# Patient Record
Sex: Male | Born: 1955
Health system: Southern US, Community
[De-identification: ages and names within clinical notes are randomized; demographics above are authoritative.]

## PROBLEM LIST (undated history)

## (undated) DIAGNOSIS — E785 Hyperlipidemia, unspecified: Secondary | ICD-10-CM

## (undated) DIAGNOSIS — R5383 Other fatigue: Secondary | ICD-10-CM

## (undated) DIAGNOSIS — T7840XA Allergy, unspecified, initial encounter: Secondary | ICD-10-CM

## (undated) DIAGNOSIS — S76311A Strain of muscle, fascia and tendon of the posterior muscle group at thigh level, right thigh, initial encounter: Secondary | ICD-10-CM

## (undated) DIAGNOSIS — R008 Other abnormalities of heart beat: Secondary | ICD-10-CM

## (undated) DIAGNOSIS — C4490 Unspecified malignant neoplasm of skin, unspecified: Secondary | ICD-10-CM

## (undated) DIAGNOSIS — Z8719 Personal history of other diseases of the digestive system: Secondary | ICD-10-CM

## (undated) DIAGNOSIS — R002 Palpitations: Secondary | ICD-10-CM

## (undated) DIAGNOSIS — J45909 Unspecified asthma, uncomplicated: Secondary | ICD-10-CM

## (undated) DIAGNOSIS — G500 Trigeminal neuralgia: Secondary | ICD-10-CM

## (undated) HISTORY — PX: MOUTH SURGERY: SHX715

## (undated) HISTORY — DX: Unspecified asthma, uncomplicated: J45.909

## (undated) HISTORY — DX: Other abnormalities of heart beat: R00.8

## (undated) HISTORY — DX: Allergy, unspecified, initial encounter: T78.40XA

## (undated) HISTORY — DX: Hyperlipidemia, unspecified: E78.5

## (undated) HISTORY — DX: Other fatigue: R53.83

---
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
## 2014-10-03 ENCOUNTER — Encounter: Payer: Self-pay | Admitting: Family Medicine

## 2014-10-03 ENCOUNTER — Encounter (INDEPENDENT_AMBULATORY_CARE_PROVIDER_SITE_OTHER): Payer: Self-pay

## 2014-10-03 ENCOUNTER — Ambulatory Visit (INDEPENDENT_AMBULATORY_CARE_PROVIDER_SITE_OTHER): Payer: Managed Care, Other (non HMO) | Admitting: Family Medicine

## 2014-10-03 VITALS — BP 132/85 | HR 56 | Ht 71.0 in | Wt 150.0 lb

## 2014-10-03 DIAGNOSIS — S76311A Strain of muscle, fascia and tendon of the posterior muscle group at thigh level, right thigh, initial encounter: Secondary | ICD-10-CM

## 2014-10-03 MED ORDER — NITROGLYCERIN 0.2 MG/HR TD PT24: MEDICATED_PATCH | TRANSDERMAL | Status: DC

## 2014-10-03 NOTE — Patient Instructions (Signed)
You have a hamstring strain. Do hamstring curls and swings 3 sets of 10 once a day.  Add ankle weight if these become too easy. Eccentric exercise at the gym as you have been doing with light weight - also 3 sets of 10 - slowly increase the weight as long as pain is minimally. Compression sleeve - wear every time you're up and walking around for next 6 weeks. Heat 15 minutes at a time 3-4 times a day - can use ice after exercising instead. Nitro patches - 1/4th patch over affected area, change daily. Follow up with me in 4 weeks or just before the race.

## 2014-10-10 ENCOUNTER — Ambulatory Visit
Admission: RE | Admit: 2014-10-10 | Discharge: 2014-10-10 | Disposition: A | Discharge status: Home / Self Care | Payer: Managed Care, Other (non HMO) | Source: Ambulatory Visit | Attending: Orthopedic Surgery | Admitting: Orthopedic Surgery

## 2014-10-10 ENCOUNTER — Other Ambulatory Visit: Payer: Self-pay | Admitting: Orthopedic Surgery

## 2014-10-10 DIAGNOSIS — S82122A Displaced fracture of lateral condyle of left tibia, initial encounter for closed fracture: Secondary | ICD-10-CM

## 2014-10-10 DIAGNOSIS — S76311A Strain of muscle, fascia and tendon of the posterior muscle group at thigh level, right thigh, initial encounter: Secondary | ICD-10-CM | POA: Insufficient documentation

## 2014-10-10 HISTORY — DX: Strain of muscle, fascia and tendon of the posterior muscle group at thigh level, right thigh, initial encounter: S76.311A

## 2014-10-10 NOTE — Assessment & Plan Note (Signed)
shown home exercises to do daily.  Compression sleeve, heat.  Trial nitro patches as well - discussed risks of headache, skin irritation.  Advised against injection as this was an acute injury with strain, not a tendinopathy.

## 2014-10-10 NOTE — Progress Notes (Signed)
PCP: No primary care provider on file.  Subjective:   HPI: Patient is a 59 y.o. male here for right hamstring injury.  Patient reports about 4 weeks ago when running at [facility] on the track his right foot was caught in his running pants, he tripped and felt a pull in proximal/mid hamstring area. Stopped running as a result. Is training for [employer]. Difficulty sitting and if he tries to run now.  No past medical history on file.  No current outpatient prescriptions on file prior to visit.   No current facility-administered medications on file prior to visit.    No past surgical history on file.  No Known Allergies  History   Social History  . Marital Status: Married    Spouse Name: N/A  . Number of Children: N/A  . Years of Education: N/A   Occupational History  . Not on file.   Social History Main Topics  . Smoking status: Never Smoker   . Smokeless tobacco: Not on file  . Alcohol Use: Not on file  . Drug Use: Not on file  . Sexual Activity: Not on file   Other Topics Concern  . Not on file   Social History Narrative  . No narrative on file    No family history on file.  BP 132/85 mmHg  Pulse 56  Ht 5\' 11"  (1.803 m)  Wt 150 lb (68.04 kg)  BMI 20.93 kg/m2  Review of Systems: See HPI above.    Objective:  Physical Exam:  Gen: NAD  Right leg: No gross deformity, swelling, bruising, defect. TTP medial hamstring at insertion and just distal to this. FROM knee and hip. Pain and 4/5 strength with resisted knee flexion at 30 degrees - 5/5 with mild pain at 90 degrees. NVI distally.    Assessment & Plan:  1. Right hamstring strain - shown home exercises to do daily.  Compression sleeve, heat.  Trial nitro patches as well - discussed risks of headache, skin irritation.  Advised against injection as this was an acute injury with strain, not a tendinopathy.

## 2014-10-11 ENCOUNTER — Other Ambulatory Visit: Payer: Managed Care, Other (non HMO)

## 2014-10-12 ENCOUNTER — Encounter: Payer: Self-pay | Admitting: Physician Assistant

## 2014-10-12 ENCOUNTER — Ambulatory Visit (INDEPENDENT_AMBULATORY_CARE_PROVIDER_SITE_OTHER): Payer: Managed Care, Other (non HMO) | Admitting: Physician Assistant

## 2014-10-12 VITALS — BP 122/74 | HR 76 | Temp 98.0°F | Resp 16

## 2014-10-12 DIAGNOSIS — B029 Zoster without complications: Secondary | ICD-10-CM

## 2014-10-12 DIAGNOSIS — Z8619 Personal history of other infectious and parasitic diseases: Secondary | ICD-10-CM

## 2014-10-12 HISTORY — DX: Personal history of other infectious and parasitic diseases: Z86.19

## 2014-10-12 MED ORDER — ZOSTER VACCINE LIVE 19400 UNT/0.65ML ~~LOC~~ SOLR
0.6500 mL | Freq: Once | SUBCUTANEOUS | Status: DC
Start: 2014-10-12 — End: 2019-04-22

## 2014-10-12 MED ORDER — VALACYCLOVIR HCL 1 G PO TABS: 1000.0000 mg | ORAL_TABLET | Freq: Three times a day (TID) | ORAL | Status: DC

## 2014-10-12 MED ORDER — GABAPENTIN 300 MG PO CAPS: 300.0000 mg | ORAL_CAPSULE | Freq: Every day | ORAL | Status: DC

## 2014-10-12 NOTE — Progress Notes (Signed)
   Subjective:    Patient ID: Michael Oneal, male    DOB: January 14, 1956, 59 y.o.   MRN: [id number]  HPI  This is a 59 year old male who is presenting with blisters along his left lower back and abdomen x 1 day. States for 30 years he will occasionally get shooting pain that starts in his left lower back and shoots around his left abdomen. He states he gets similar shooting pain in the left side of his face x 30 years. He reports yesterday he started getting shooting back in his left back and abdomen - today he woke with blisters. He denies fever, chills, N/V. He has tried advil with no relief in the past or present.  He recently sustained a tibial plateau fracture while skiing in [place]. He was supposed to have surgery today but states the surgeon called today and told him it could be managed without surgery.  Review of Systems  Constitutional: Negative for fever and chills.  Gastrointestinal: Positive for abdominal pain. Negative for nausea and vomiting.  Musculoskeletal: Positive for back pain.  Skin: Positive for rash.  Neurological: Negative for numbness.  Hematological: Negative for adenopathy.   Patient Active Problem List   Diagnosis Date Noted  . Right hamstring muscle strain 10/10/2014   Home meds: none  No Known Allergies   Patient's social and family history were reviewed.     Objective:   Physical Exam  Constitutional: He is oriented to person, place, and time. He appears well-developed and well-nourished. No distress.  HENT:  Head: Normocephalic and atraumatic.  Right Ear: Hearing normal.  Left Ear: Hearing normal.  Nose: Nose normal.  Eyes: Conjunctivae and lids are normal. Right eye exhibits no discharge. Left eye exhibits no discharge. No scleral icterus.  Cardiovascular: Normal rate, regular rhythm, normal heart sounds, intact distal pulses and normal pulses.   No murmur heard. Pulmonary/Chest: Effort normal and breath sounds normal. No respiratory distress. He  has no wheezes. He has no rhonchi. He has no rales.  Musculoskeletal: Normal range of motion.  Pt is using crutches and wearing a hinge knee brace on left leg d/t recent tibial plateau fx  Lymphadenopathy:    He has no cervical adenopathy.  Neurological: He is alert and oriented to person, place, and time. Gait abnormal.  Skin: Skin is warm and dry.  Herpetic eruption along lower left back and abdomen in T11-L1 distribution.  Psychiatric: He has a normal mood and affect. His speech is normal and behavior is normal. Thought content normal.   BP 122/74 mmHg  Pulse 76  Temp(Src) 98 F (36.7 C) (Oral)  Resp 16  SpO2 98%     Assessment & Plan:  1. Shingles Valtrex TID x 14 days. Gabapentin 300 mg QHS. Prescription for zostavax given. He will return in 2 weeks for follow up.  - valACYclovir (VALTREX) 1000 MG tablet; Take 1 tablet (1,000 mg total) by mouth 3 (three) times daily.  Dispense: 42 tablet; Refill: 0 - gabapentin (NEURONTIN) 300 MG capsule; Take 1 capsule (300 mg total) by mouth at bedtime.  Dispense: 60 capsule; Refill: 0 - zoster vaccine live, PF, (ZOSTAVAX) 93716 UNT/0.65ML injection; Inject 19,400 Units into the skin once.  Dispense: 1 each; Refill: 0   Benjaman Pott. Drenda Freeze, MHS [facility]  10/12/2014

## 2014-10-12 NOTE — Patient Instructions (Signed)
Take valtrex three times a day. Take gabapentin at night for nerve pain. Take prescription for shingles vaccine to [facility]. Return in 2 weeks for follow up.

## 2014-10-27 ENCOUNTER — Ambulatory Visit: Payer: Managed Care, Other (non HMO) | Admitting: Family Medicine

## 2014-10-31 ENCOUNTER — Ambulatory Visit: Payer: Managed Care, Other (non HMO) | Admitting: Physician Assistant

## 2015-02-08 ENCOUNTER — Ambulatory Visit (INDEPENDENT_AMBULATORY_CARE_PROVIDER_SITE_OTHER): Payer: Self-pay | Admitting: Neurology

## 2015-02-08 ENCOUNTER — Ambulatory Visit (INDEPENDENT_AMBULATORY_CARE_PROVIDER_SITE_OTHER): Payer: Managed Care, Other (non HMO) | Admitting: Neurology

## 2015-02-08 DIAGNOSIS — G629 Polyneuropathy, unspecified: Secondary | ICD-10-CM

## 2015-02-08 DIAGNOSIS — Z0289 Encounter for other administrative examinations: Secondary | ICD-10-CM

## 2015-02-08 NOTE — Progress Notes (Signed)
  [facility]    Provider:  Dr Jaynee Eagles Referring Provider: Earlie Server, MD Primary Care Physician:  No primary care provider on file.  HPI:  Michael Oneal is a 59 y.o. male here as a referral from Dr. French Ana for right arm pain and weakness.  He rolled over in bed one night and felt like someone put a hot iron into his arm in the deltoid region. It was searing agonizing pain. Pain is currently in the deltoid and in the anterior shoulder areas. neurontin helped. He had a skiing accident the week before. There is muscle wasting in right deltoid. Right arm Deltoid weakness. No neck pain. The other day he was running and all the fingers of the right arm went numb. No numbness in the skin overlying deltoid or in the upper arm. Significant deltoid weakness.   Summary  Nerve conduction studies were performed on the right upper extremities:  The right Median motor nerve showed normal conductions with normal F Wave latency The right Ulnar motor nerve showed normal conductions with normal F Wave latency The right Radial motor nerve was within normal limits  The right second-digit Median sensory nerve conduction was within normal limits The right fifth-digit Ulnar sensory nerve conduction was within normal limits The right Radial sensory nerve conduction was within normal limits The right Medial and Lateral Antebrachial Cutaneous sensory nerves were within normal limits The Axillary motor nerves were comparable bilaterally.  EMG Needle study was performed on selected right-sided muscles:   The Deltoid Medial head showed increased spontaneous activity(psw) and diminished motor unit recruitment. The Deltoid Anterior head showed markedly increased spontaneous activity (psw) and diminished motor unit recruitment. The Supraspinatus, Infraspinatus, Teres minor, Triceps, Biceps, Brachialis, Brachioradialis, Pronator Teres, Opponens Pollicis, First Dorsal interosseous muscles and C5/C6  paraspinals were within normal limits.  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 The axillary nerve divides into two major trunks. The posterior trunk supplies the Teres minor and posterior part of the Deltoid before terminating as the axillary sensory nerve and providing cutaneous sensation overlying the Deltoid. The anterior trunk travels deep and supplies the medial and anterior heads of the Deltoid as well as a deep sensory branch to the shoulder.   No electrodiagnostic evidence for radiculopathy, brachial plexopathy or involvement of other proximal nerves. The clavicular portion of the pectoral major muscle is in close proximity and ajacent to the anterior head of the deltoid, recommend MRI of the muscles in this area to examine the muscles supplied by the Axillary nerve, the rotator cuff muscles and to confirm the suspected involvement of the anterior and medial heads of the Deltoid.   Michael Ill, MD  [facility] [address] [place], [place] [address]  Phone [phone] Fax [phone]

## 2015-02-12 NOTE — Progress Notes (Signed)
See procedure note.

## 2015-02-12 NOTE — Procedures (Signed)
[facility]    Provider:  Dr Jaynee Eagles Referring Provider: Earlie Server, MD Primary Care Physician:  No primary care provider on file.  HPI:  Michael Oneal is a 59 y.o. male here as a referral from Dr. French Ana for right arm pain and weakness.  He rolled over in bed one night and felt like someone put a hot iron into his arm in the deltoid region. It was searing agonizing pain. Pain is currently in the deltoid and in the anterior shoulder areas. neurontin helped. He had a skiing accident the week before. There is muscle wasting in right deltoid. Right arm Deltoid weakness. No neck pain. The other day he was running and all the fingers of the right arm went numb. No numbness in the skin overlying deltoid or in the upper arm. Significant deltoid weakness.   Summary  Nerve conduction studies were performed on the right upper extremities:  The right Median motor nerve showed normal conductions with normal F Wave latency The right Ulnar motor nerve showed normal conductions with normal F Wave latency The right Radial motor nerve was within normal limits  The right second-digit Median sensory nerve conduction was within normal limits The right fifth-digit Ulnar sensory nerve conduction was within normal limits The right Radial sensory nerve conduction was within normal limits The right Medial and Lateral Antebrachial Cutaneous sensory nerves were within normal limits The Axillary motor nerves were comparable bilaterally.  EMG Needle study was performed on selected right-sided muscles:   The Deltoid Medial head showed increased spontaneous activity(psw) and diminished motor unit recruitment. The Deltoid Anterior head showed markedly increased spontaneous activity (psw) and diminished motor unit recruitment. The Supraspinatus, Infraspinatus, Teres minor, Triceps, Biceps, Brachialis, Brachioradialis, Pronator Teres, Opponens Pollicis, First Dorsal interosseous muscles and C5/C6  paraspinals were within normal limits.  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 This is supplied by the anterior trunk of the Axillary nerve. The axillary nerve divides into two major trunks; The posterior trunk supplies the Teres minor and posterior part of the Deltoid before terminating as the axillary sensory nerve and providing cutaneous sensation overlying the Deltoid. The anterior trunk travels deep and supplies the medial and anterior heads of the Deltoid as well as a deep sensory branch to the shoulder.   No electrodiagnostic evidence for radiculopathy, brachial plexopathy or involvement of other proximal nerves. The clavicular portion of the pectoral major muscle is in close proximity and ajacent to the anterior head of the deltoid, recommend MRI of the muscles in this area to examine the muscles supplied by the Axillary nerve, the rotator cuff muscles and evaluate the suspected involvement of the anterior and medial heads of the Deltoid.   Sarina Ill, MD  [facility] [address] [place], [place] [address]  Phone [phone] Fax [phone]

## 2016-12-25 ENCOUNTER — Emergency Department (HOSPITAL_COMMUNITY)
Admission: EM | Admit: 2016-12-25 | Discharge: 2016-12-25 | Disposition: A | Discharge status: Home / Self Care | Payer: Managed Care, Other (non HMO) | Attending: Emergency Medicine | Admitting: Emergency Medicine

## 2016-12-25 ENCOUNTER — Encounter (HOSPITAL_COMMUNITY): Payer: Self-pay | Admitting: Emergency Medicine

## 2016-12-25 DIAGNOSIS — W260XXA Contact with knife, initial encounter: Secondary | ICD-10-CM | POA: Diagnosis not present

## 2016-12-25 DIAGNOSIS — Y93G1 Activity, food preparation and clean up: Secondary | ICD-10-CM | POA: Insufficient documentation

## 2016-12-25 DIAGNOSIS — S61012A Laceration without foreign body of left thumb without damage to nail, initial encounter: Secondary | ICD-10-CM

## 2016-12-25 DIAGNOSIS — Y92000 Kitchen of unspecified non-institutional (private) residence as  the place of occurrence of the external cause: Secondary | ICD-10-CM | POA: Insufficient documentation

## 2016-12-25 DIAGNOSIS — Y999 Unspecified external cause status: Secondary | ICD-10-CM | POA: Insufficient documentation

## 2016-12-25 DIAGNOSIS — S60932A Unspecified superficial injury of left thumb, initial encounter: Secondary | ICD-10-CM | POA: Diagnosis present

## 2016-12-25 DIAGNOSIS — Z79899 Other long term (current) drug therapy: Secondary | ICD-10-CM | POA: Insufficient documentation

## 2016-12-25 NOTE — ED Triage Notes (Signed)
Pt states he was cutting an onion at dinner tonight and cut his thumb.  Small 1/4 in laceration noted to thumb.  Bleeding controlled.

## 2016-12-25 NOTE — ED Provider Notes (Signed)
[facility] DEPT Provider Note   CSN: [id number] Arrival date & time: 12/25/16  1907     History   Chief Complaint Chief Complaint  Patient presents with  . Thumb Laceration    HPI Michael Oneal is a 61 y.o. male.  HPI   Patient is a 61 year old male with no pertinent past medical history presents the ED with complaint of thumb laceration, onset PTA. Patient reports while he was cutting an onion using a kitchen knife he cut the tip of his left thumb. Patient reports that bleeding was controlled after applying pressure prior to arrival.Denies associated pain, swelling, redness, drainage, numbness, weakness or decreased range of motion.Tetanus status unknown.  History reviewed. No pertinent past medical history.  Patient Active Problem List   Diagnosis Date Noted  . Right hamstring muscle strain 10/10/2014    History reviewed. No pertinent surgical history.     Home Medications    Prior to Admission medications   Medication Sig Start Date End Date Taking? Authorizing Provider  gabapentin (NEURONTIN) 300 MG capsule Take 1 capsule (300 mg total) by mouth at bedtime. 10/12/14   Ezekiel Slocumb, PA-C  valACYclovir (VALTREX) 1000 MG tablet Take 1 tablet (1,000 mg total) by mouth 3 (three) times daily. 10/12/14   Ezekiel Slocumb, PA-C  zoster vaccine live, PF, (ZOSTAVAX) 94496 UNT/0.65ML injection Inject 19,400 Units into the skin once. 10/12/14   Ezekiel Slocumb, PA-C    Family History Family History  Problem Relation Age of Onset  . Heart disease Mother   . Hypertension Mother   . Hyperlipidemia Mother   . Heart disease Father     Social History Social History  Substance Use Topics  . Smoking status: Never Smoker  . Smokeless tobacco: Never Used  . Alcohol use 0.0 oz/week     Allergies   Patient has no known allergies.   Review of Systems Review of Systems  Musculoskeletal: Negative for arthralgias and joint swelling.  Skin: Positive for wound (laceration).    Neurological: Negative for weakness and numbness.     Physical Exam Updated Vital Signs BP (!) 145/78 (BP Location: Left Arm)   Pulse (!) 52   Temp 98.6 F (37 C) (Oral)   Resp 16   Ht 5\' 11"  (1.803 m)   Wt 68.5 kg (151 lb)   SpO2 100%   BMI 21.06 kg/m   Physical Exam  Constitutional: He is oriented to person, place, and time. He appears well-developed and well-nourished. No distress.  HENT:  Head: Normocephalic and atraumatic.  Eyes: Conjunctivae and EOM are normal. Right eye exhibits no discharge. Left eye exhibits no discharge. No scleral icterus.  Neck: Normal range of motion. Neck supple.  Cardiovascular: Normal rate and intact distal pulses.   Pulmonary/Chest: Effort normal.  Musculoskeletal: Normal range of motion. He exhibits no edema, tenderness or deformity.  FROM of left hand with 5/5 strength. Equal grip strength bilaterally. Full range of motion of left thumb. Sensation grossly intact. 2+ radial pulse. Cap refill less than 2.  Neurological: He is alert and oriented to person, place, and time.  Skin: Skin is warm and dry. Capillary refill takes less than 2 seconds. He is not diaphoretic.  0.5cm superficial laceration which has healed closed present to tip of left thumb. No active bleeding. No surrounding swelling, warmth, erythema or drainage. No involvement of left nail.  Nursing note and vitals reviewed.    ED Treatments / Results  Labs (all labs ordered are listed, but  only abnormal results are displayed) Labs Reviewed - No data to display  EKG  EKG Interpretation None       Radiology No results found.  Procedures Procedures (including critical care time)  Medications Ordered in ED Medications - No data to display   Initial Impression / Assessment and Plan / ED Course  I have reviewed the triage vital signs and the nursing notes.  Pertinent labs & imaging results that were available during my care of the patient were reviewed by me and  considered in my medical decision making (see chart for details).     Patient presents with clean superficial laceration that has healed shut, no active bleeding. Laceration occurred prior to arrival. Pressure irrigation performed. Wound visualized in a bloodless field without evidence of foreign body.  Due to laceration being minor, very superficial and already healed closed I do not feel taht sutures are warranted at this time. Patient reports tetanus status is unknown but states he has a history of prior allergy to tetanus shot As a child when the shot consistent of forced serum. I discussed importance of updating tetanus shot today however patient declined after reporting understanding of possible tetanus infection. Due to patient with minor clean laceration, I advised patient to contact his PCP tomorrow To check on his tetanus status and have his tetanus updated if needed. Pt has no comorbidities to effect normal wound healing. Pt discharged without antibiotics.  Discussed wound care and strict return precautions.. Pt is hemodynamically stable with no complaints prior to dc.    Final Clinical Impressions(s) / ED Diagnoses   Final diagnoses:  Laceration of right index finger without foreign body without damage to nail, initial encounter    New Prescriptions New Prescriptions   No medications on file     Renold Don 12/25/16 2022    Lacretia Leigh, MD 12/26/16 [phone]

## 2016-12-25 NOTE — Discharge Instructions (Signed)
Keep wound clean using Dial antibacterial soap and water, pat dry. He may apply a small amount of Neosporin antibiotic ointment to wound once daily. He may take Tylenol or ibuprofen as prescribed over-the-counter as needed for pain relief. I recommend fine up with your primary care provider within the next week for wound recheck if her symptoms have not improved. Please return to the Emergency Department if symptoms worsen or new onset of fever, redness, swelling, warmth, drainage, decreased range of motion.

## 2018-03-19 ENCOUNTER — Emergency Department (HOSPITAL_COMMUNITY)
Admission: EM | Admit: 2018-03-19 | Discharge: 2018-03-19 | Disposition: A | Discharge status: Home / Self Care | Payer: BLUE CROSS/BLUE SHIELD | Attending: Emergency Medicine | Admitting: Emergency Medicine

## 2018-03-19 ENCOUNTER — Emergency Department (HOSPITAL_COMMUNITY): Payer: BLUE CROSS/BLUE SHIELD

## 2018-03-19 ENCOUNTER — Other Ambulatory Visit: Payer: Self-pay

## 2018-03-19 DIAGNOSIS — W0110XA Fall on same level from slipping, tripping and stumbling with subsequent striking against unspecified object, initial encounter: Secondary | ICD-10-CM | POA: Insufficient documentation

## 2018-03-19 DIAGNOSIS — S52124A Nondisplaced fracture of head of right radius, initial encounter for closed fracture: Secondary | ICD-10-CM | POA: Insufficient documentation

## 2018-03-19 DIAGNOSIS — Y9301 Activity, walking, marching and hiking: Secondary | ICD-10-CM | POA: Diagnosis not present

## 2018-03-19 DIAGNOSIS — Y929 Unspecified place or not applicable: Secondary | ICD-10-CM | POA: Diagnosis not present

## 2018-03-19 DIAGNOSIS — S51011A Laceration without foreign body of right elbow, initial encounter: Secondary | ICD-10-CM | POA: Diagnosis not present

## 2018-03-19 DIAGNOSIS — S59901A Unspecified injury of right elbow, initial encounter: Secondary | ICD-10-CM | POA: Diagnosis not present

## 2018-03-19 DIAGNOSIS — Y999 Unspecified external cause status: Secondary | ICD-10-CM | POA: Insufficient documentation

## 2018-03-19 DIAGNOSIS — S59911A Unspecified injury of right forearm, initial encounter: Secondary | ICD-10-CM | POA: Diagnosis not present

## 2018-03-19 DIAGNOSIS — S51011S Laceration without foreign body of right elbow, sequela: Secondary | ICD-10-CM | POA: Diagnosis not present

## 2018-03-19 DIAGNOSIS — M25521 Pain in right elbow: Secondary | ICD-10-CM | POA: Diagnosis not present

## 2018-03-19 DIAGNOSIS — S51021A Laceration with foreign body of right elbow, initial encounter: Secondary | ICD-10-CM | POA: Diagnosis not present

## 2018-03-19 MED ORDER — LIDOCAINE-EPINEPHRINE (PF) 2 %-1:200000 IJ SOLN
20.0000 mL | Freq: Once | INTRAMUSCULAR | Status: AC
  Administered 2018-03-19: 20 mL via INTRADERMAL
  Filled 2018-03-19 (×2): qty 20

## 2018-03-19 MED ORDER — CEPHALEXIN 500 MG PO CAPS: 500.0000 mg | ORAL_CAPSULE | Freq: Four times a day (QID) | ORAL | 0 refills | Status: DC

## 2018-03-19 NOTE — ED Provider Notes (Signed)
[facility] DEPT Provider Note   CSN: [id number] Arrival date & time: 03/19/18  1551     History   Chief Complaint Chief Complaint  Patient presents with  . Fall  . Elbow Pain  . Abrasion    HPI Michael Oneal is a 62 y.o. male.  HPI Patient presents to the emergency room for evaluation of a laceration and injury to his right elbow.  Patient was running when he tripped and landed on his right hand and elbow.  Patient states he has some soreness in his hip and his knee but is primarily having pain in his right elbow.  Patient was seen by a nurse and then at an urgent care but they recommended he come to the ED.  Patient denies any numbness or weakness.  He is having pain and soreness primarily in his right elbow. No past medical history on file.  Patient Active Problem List   Diagnosis Date Noted  . Right hamstring muscle strain 10/10/2014    No past surgical history on file.      Home Medications    Prior to Admission medications   Medication Sig Start Date End Date Taking? Authorizing Provider  cephALEXin (KEFLEX) 500 MG capsule Take 1 capsule (500 mg total) by mouth 4 (four) times daily. 03/19/18   Dorie Rank, MD  gabapentin (NEURONTIN) 300 MG capsule Take 1 capsule (300 mg total) by mouth at bedtime. Patient not taking: Reported on 03/19/2018 10/12/14   Ezekiel Slocumb, PA-C  valACYclovir (VALTREX) 1000 MG tablet Take 1 tablet (1,000 mg total) by mouth 3 (three) times daily. Patient not taking: Reported on 03/19/2018 10/12/14   Ezekiel Slocumb, PA-C  zoster vaccine live, PF, (ZOSTAVAX) 93235 UNT/0.65ML injection Inject 19,400 Units into the skin once. Patient not taking: Reported on 03/19/2018 10/12/14   Tacy Dura    Family History Family History  Problem Relation Age of Onset  . Heart disease Mother   . Hypertension Mother   . Hyperlipidemia Mother   . Heart disease Father     Social History Social History   Tobacco Use  .  Smoking status: Never Smoker  . Smokeless tobacco: Never Used  Substance Use Topics  . Alcohol use: Yes    Alcohol/week: 0.0 standard drinks  . Drug use: No     Allergies   Patient has no known allergies.   Review of Systems Review of Systems  All other systems reviewed and are negative.    Physical Exam Updated Vital Signs BP (!) 147/88 (BP Location: Left Arm)   Pulse (!) 47   Temp 98 F (36.7 C) (Oral)   Resp 16   Ht 1.803 m (5\' 11" )   Wt 67.1 kg   SpO2 100%   BMI 20.64 kg/m   Physical Exam  Constitutional: He appears well-developed and well-nourished. No distress.  HENT:  Head: Normocephalic and atraumatic.  Right Ear: External ear normal.  Left Ear: External ear normal.  Eyes: Conjunctivae are normal. Right eye exhibits no discharge. Left eye exhibits no discharge. No scleral icterus.  Neck: Neck supple. No tracheal deviation present.  Cardiovascular: Normal rate.  Pulmonary/Chest: Effort normal. No stridor. No respiratory distress.  Abdominal: He exhibits no distension.  Musculoskeletal: He exhibits no edema.       Right elbow: He exhibits laceration. Tenderness found.  Neurological: He is alert. Cranial nerve deficit: no gross deficits.  Skin: Skin is warm and dry. No rash noted.  Psychiatric:  He has a normal mood and affect.  Nursing note and vitals reviewed.    ED Treatments / Results  Labs (all labs ordered are listed, but only abnormal results are displayed) Labs Reviewed - No data to display  EKG None  Radiology Dg Elbow Complete Right  Result Date: 03/19/2018 CLINICAL DATA:  Fall with elbow pain EXAM: RIGHT ELBOW - COMPLETE 3+ VIEW COMPARISON:  None. FINDINGS: Small elbow effusion. Gas over the olecranon soft tissues presumably due to laceration. No dislocation. Suspected small acute cortical fracture of the radial head IMPRESSION: 1. Suspected small acute cortical fracture of the radial head. There appears to be small effusion 2. Gas within  the posterior elbow soft tissues presumably due to history of laceration Electronically Signed   By: Donavan Foil M.D.   On: 03/19/2018 16:33    Procedures .Marland KitchenLaceration Repair Date/Time: 03/19/2018 8:05 PM Performed by: Dorie Rank, MD Authorized by: Dorie Rank, MD   Consent:    Consent obtained:  Verbal   Consent given by:  Patient   Risks discussed:  Infection, need for additional repair, pain, poor cosmetic result and poor wound healing   Alternatives discussed:  No treatment and delayed treatment Universal protocol:    Procedure explained and questions answered to patient or proxy's satisfaction: yes     Relevant documents present and verified: yes     Test results available and properly labeled: yes     Imaging studies available: yes     Required blood products, implants, devices, and special equipment available: yes     Site/side marked: yes     Immediately prior to procedure, a time out was called: yes     Patient identity confirmed:  Verbally with patient Anesthesia (see MAR for exact dosages):    Anesthesia method:  Local infiltration   Local anesthetic:  Lidocaine 2% WITH epi Laceration details:    Length (cm):  2 Repair type:    Repair type:  Intermediate Exploration:    Wound exploration: wound explored through full range of motion     Wound extent: foreign bodies/material     Wound extent: no nerve damage noted, no tendon damage noted, no underlying fracture noted and no vascular damage noted     Contaminated: yes   Treatment:    Area cleansed with:  Saline   Amount of cleaning:  Standard   Irrigation solution:  Sterile saline   Irrigation method:  Pressure wash   Visualized foreign bodies/material removed: yes   Skin repair:    Repair method:  Sutures   Suture size:  4-0   Suture material:  Prolene   Suture technique:  Simple interrupted   Number of sutures:  2 Approximation:    Approximation:  Loose Post-procedure details:    Dressing:  Sterile  dressing Comments:     Fair amount of tissue loss because of the abrasion, the wound was debrided and thoroughly cleaned, all debris was removed, 2 loose sutures were placed to reapproximate the wound edges there was some slight puckering at the corner of the laceration, explained to the patient that we would not be able to have close reapproximation with this wound   (including critical care time)  Medications Ordered in ED Medications  lidocaine-EPINEPHrine (XYLOCAINE W/EPI) 2 %-1:200000 (PF) injection 20 mL (20 mLs Intradermal Given by Other 03/19/18 1853)     Initial Impression / Assessment and Plan / ED Course  I have reviewed the triage vital signs and the nursing notes.  Pertinent labs & imaging results that were available during my care of the patient were reviewed by me and considered in my medical decision making (see chart for details).   Patient presented to the emergency room for evaluation after an elbow laceration.  X-rays show the possibility of a radial head fracture.  This is not adjacent to the patient's wound.  No findings to suggest an open fracture.  Patient's wound was loosely reapproximated.  Plan on discharge home with antibiotics.  Discussed outpatient follow-up with orthopedics.  Monitor for signs of infection  Final Clinical Impressions(s) / ED Diagnoses   Final diagnoses:  Laceration of right elbow, initial encounter  Closed nondisplaced fracture of head of right radius, initial encounter    ED Discharge Orders         Ordered    cephALEXin (KEFLEX) 500 MG capsule  4 times daily     03/19/18 2003           Dorie Rank, MD 03/19/18 2010

## 2018-03-19 NOTE — Discharge Instructions (Signed)
Apply antibiotic to the wound daily, take the antibiotics as prescribed, suture removal in approximately 10 to 14 days, follow-up with your orthopedic doctor to review the x-rays that suggest the possibility of a factor at the radial head

## 2018-03-19 NOTE — ED Notes (Signed)
Last tetanus unknown

## 2018-03-19 NOTE — ED Triage Notes (Signed)
Patient arrives with c/o laceration to right arm. Patient was running and tripped, hit elbow, hand, hip, and knee on right side. Denies hitting head. Denies any blood thinners. PMS intact.

## 2018-03-20 DIAGNOSIS — S52124A Nondisplaced fracture of head of right radius, initial encounter for closed fracture: Secondary | ICD-10-CM | POA: Diagnosis not present

## 2018-03-25 DIAGNOSIS — Z85828 Personal history of other malignant neoplasm of skin: Secondary | ICD-10-CM | POA: Diagnosis not present

## 2018-03-25 DIAGNOSIS — L821 Other seborrheic keratosis: Secondary | ICD-10-CM | POA: Diagnosis not present

## 2018-03-25 DIAGNOSIS — D1800 Hemangioma unspecified site: Secondary | ICD-10-CM | POA: Diagnosis not present

## 2018-03-25 DIAGNOSIS — D225 Melanocytic nevi of trunk: Secondary | ICD-10-CM | POA: Diagnosis not present

## 2018-04-13 DIAGNOSIS — L820 Inflamed seborrheic keratosis: Secondary | ICD-10-CM | POA: Diagnosis not present

## 2018-07-31 DIAGNOSIS — M25552 Pain in left hip: Secondary | ICD-10-CM | POA: Diagnosis not present

## 2018-09-29 DIAGNOSIS — L820 Inflamed seborrheic keratosis: Secondary | ICD-10-CM | POA: Diagnosis not present

## 2018-09-29 DIAGNOSIS — L723 Sebaceous cyst: Secondary | ICD-10-CM | POA: Diagnosis not present

## 2018-11-29 IMAGING — CR DG ELBOW COMPLETE 3+V*R*
4 series · 4 of 4 positions shown · non-contrast
Comparison: None.

CLINICAL DATA: Fall with elbow pain

EXAM:
RIGHT ELBOW - COMPLETE 3+ VIEW

[x elbow ap right]
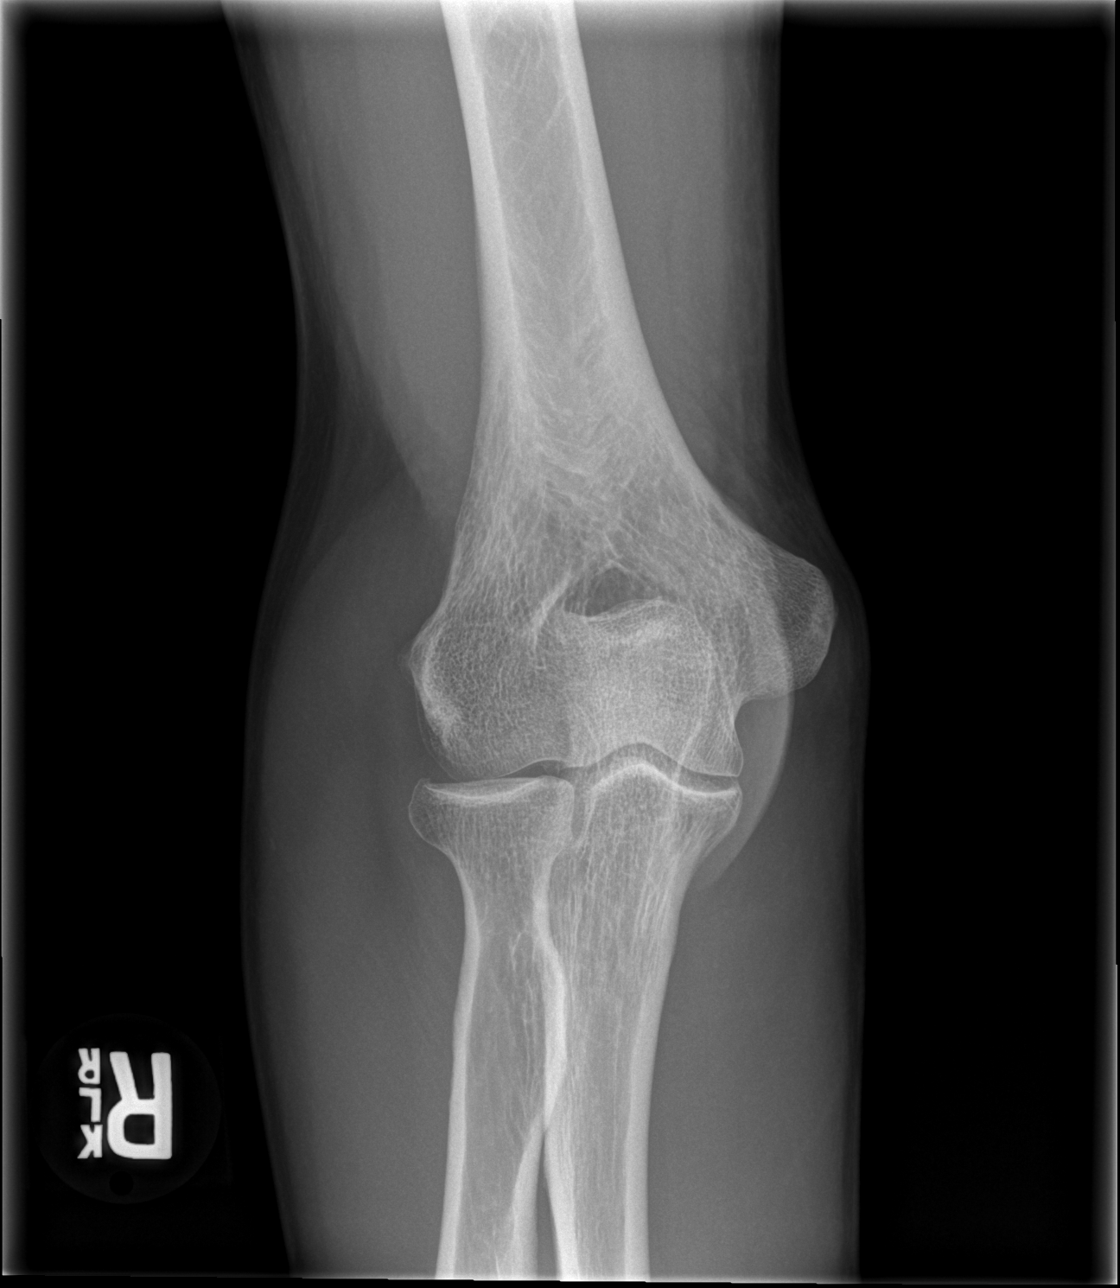

[x elbow obl right (1 of 2)]
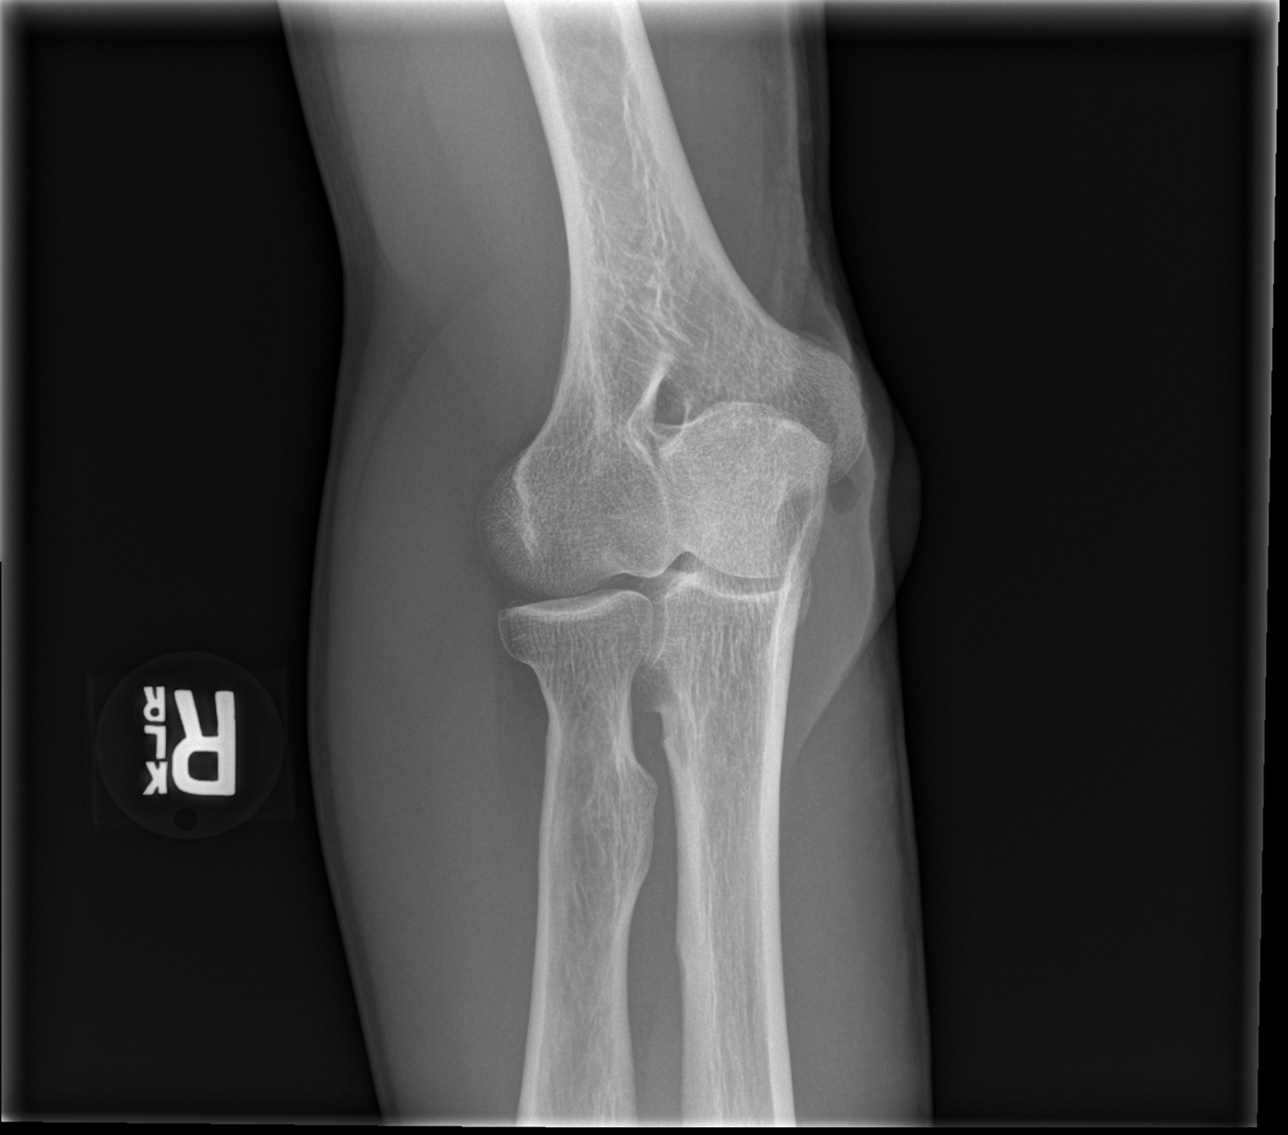

[x elbow obl right (2 of 2)]
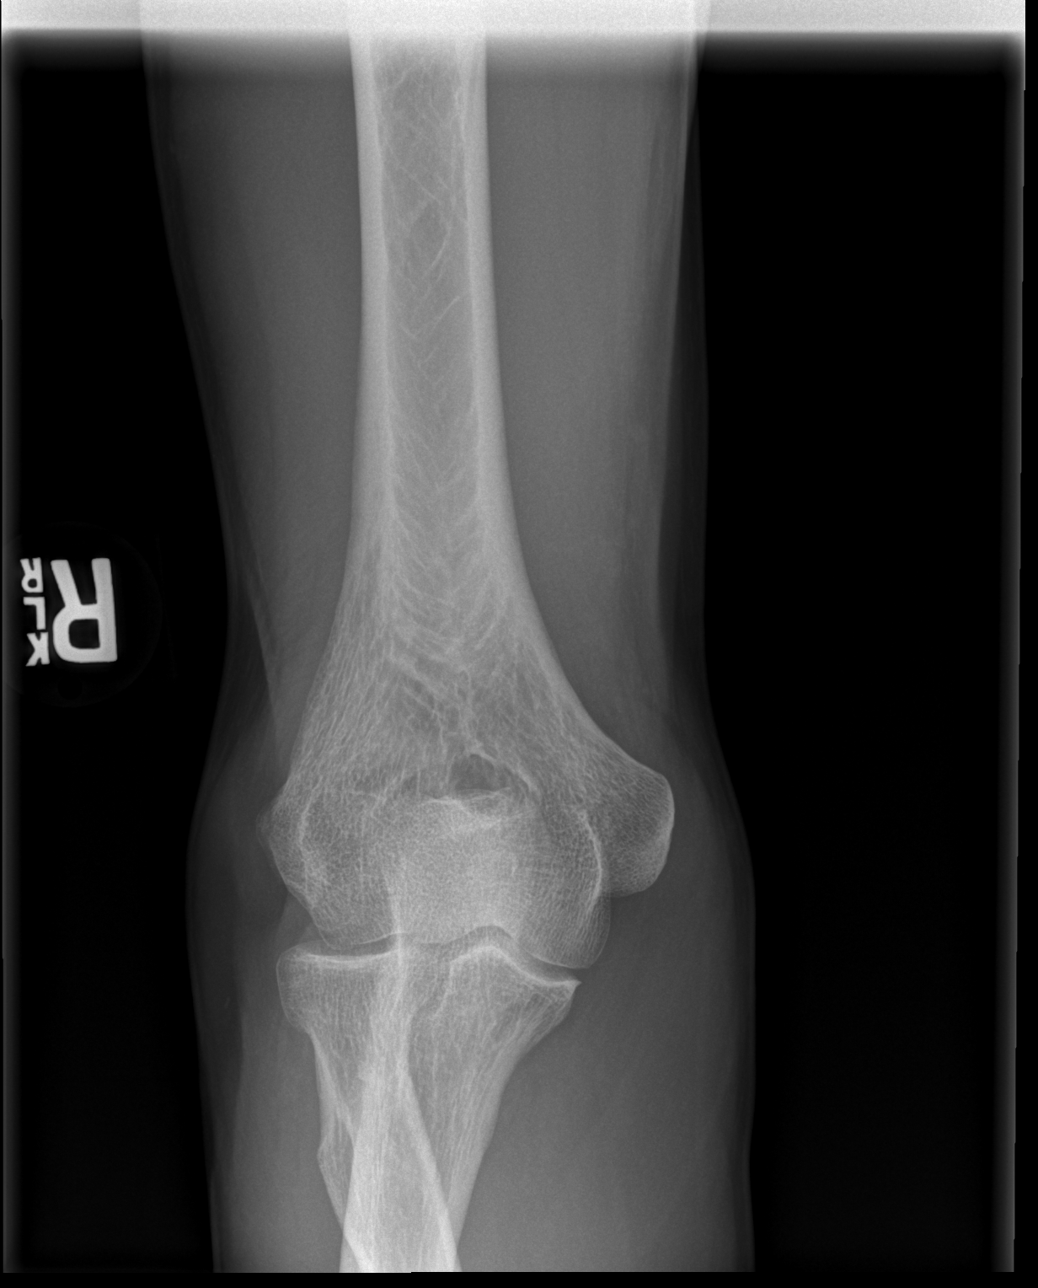

[x elbow lat right]
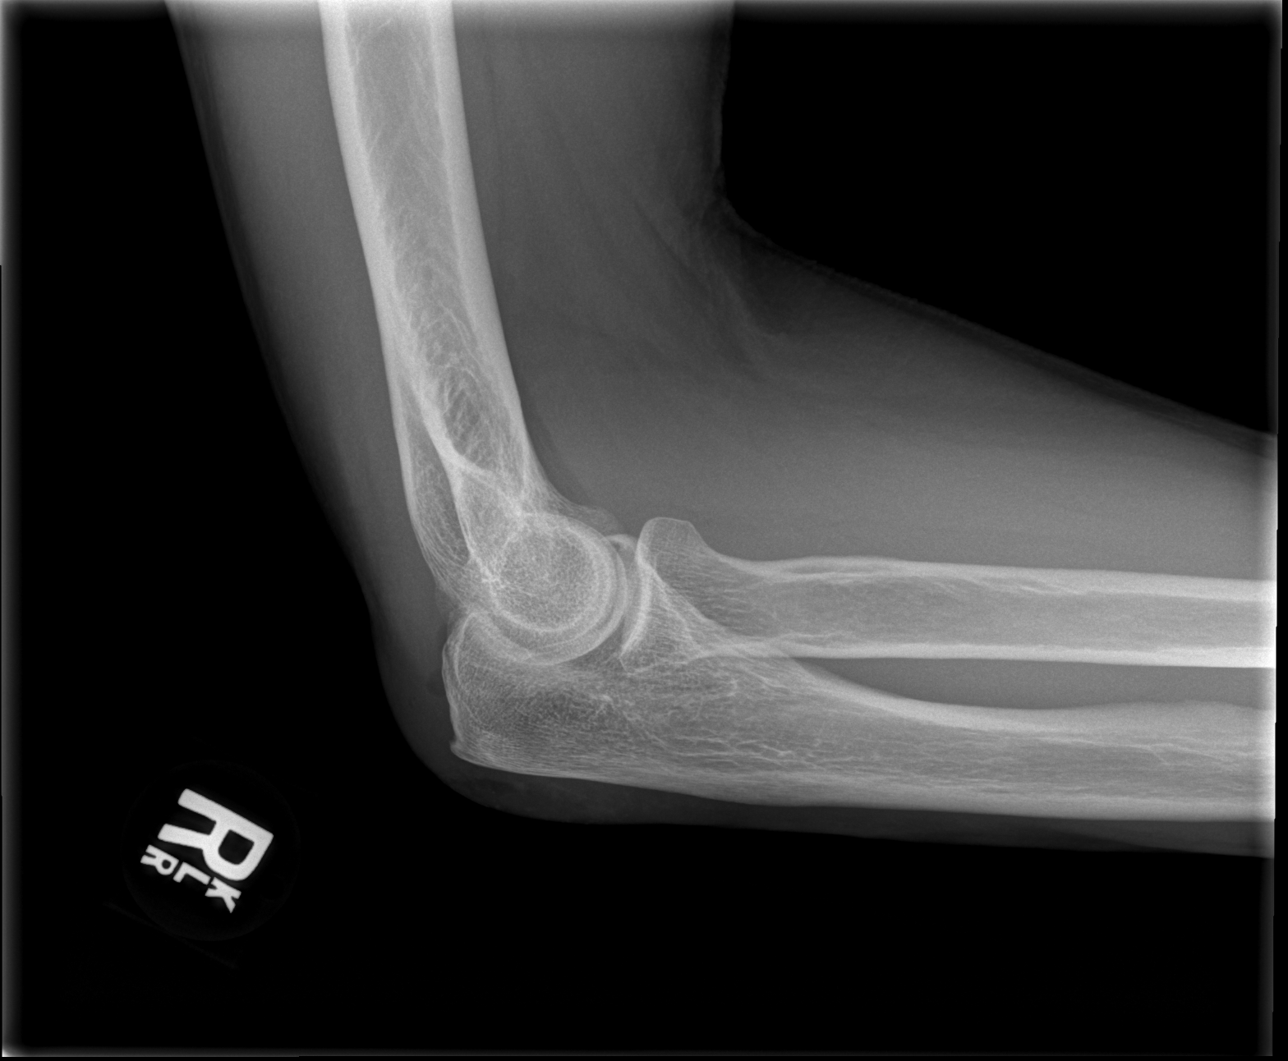

[4 of 4 positions shown; findings below may reference images not displayed]

FINDINGS: Small elbow effusion. Gas over the olecranon soft tissues presumably
due to laceration. No dislocation. Suspected small acute cortical
fracture of the radial head
IMPRESSION: 1. Suspected small acute cortical fracture of the radial head. There
appears to be small effusion
2. Gas within the posterior elbow soft tissues presumably due to
history of laceration

## 2019-04-01 DIAGNOSIS — Z85828 Personal history of other malignant neoplasm of skin: Secondary | ICD-10-CM | POA: Diagnosis not present

## 2019-04-01 DIAGNOSIS — L821 Other seborrheic keratosis: Secondary | ICD-10-CM | POA: Diagnosis not present

## 2019-04-01 DIAGNOSIS — D225 Melanocytic nevi of trunk: Secondary | ICD-10-CM | POA: Diagnosis not present

## 2019-04-01 DIAGNOSIS — L814 Other melanin hyperpigmentation: Secondary | ICD-10-CM | POA: Diagnosis not present

## 2019-04-01 DIAGNOSIS — L820 Inflamed seborrheic keratosis: Secondary | ICD-10-CM | POA: Diagnosis not present

## 2019-04-22 ENCOUNTER — Ambulatory Visit (HOSPITAL_COMMUNITY)
Admission: RE | Admit: 2019-04-22 | Discharge: 2019-04-22 | Disposition: A | Discharge status: Home / Self Care | Payer: BC Managed Care – PPO | Source: Ambulatory Visit | Attending: Internal Medicine | Admitting: Internal Medicine

## 2019-04-22 ENCOUNTER — Other Ambulatory Visit: Payer: Self-pay

## 2019-04-22 ENCOUNTER — Ambulatory Visit: Payer: BC Managed Care – PPO | Admitting: Internal Medicine

## 2019-04-22 VITALS — BP 131/83 | HR 61 | Temp 98.0°F | Ht 71.0 in | Wt 153.9 lb

## 2019-04-22 DIAGNOSIS — R002 Palpitations: Secondary | ICD-10-CM

## 2019-04-22 DIAGNOSIS — Z79899 Other long term (current) drug therapy: Secondary | ICD-10-CM

## 2019-04-22 DIAGNOSIS — E782 Mixed hyperlipidemia: Secondary | ICD-10-CM

## 2019-04-22 DIAGNOSIS — G500 Trigeminal neuralgia: Secondary | ICD-10-CM

## 2019-04-22 DIAGNOSIS — R030 Elevated blood-pressure reading, without diagnosis of hypertension: Secondary | ICD-10-CM | POA: Diagnosis not present

## 2019-04-22 DIAGNOSIS — Z0000 Encounter for general adult medical examination without abnormal findings: Secondary | ICD-10-CM

## 2019-04-22 DIAGNOSIS — Z1159 Encounter for screening for other viral diseases: Secondary | ICD-10-CM | POA: Diagnosis not present

## 2019-04-22 DIAGNOSIS — R001 Bradycardia, unspecified: Secondary | ICD-10-CM | POA: Diagnosis not present

## 2019-04-22 DIAGNOSIS — E785 Hyperlipidemia, unspecified: Secondary | ICD-10-CM | POA: Diagnosis not present

## 2019-04-22 DIAGNOSIS — J45990 Exercise induced bronchospasm: Secondary | ICD-10-CM | POA: Diagnosis not present

## 2019-04-22 MED ORDER — ALBUTEROL SULFATE HFA 108 (90 BASE) MCG/ACT IN AERS: 2.0000 | INHALATION_SPRAY | Freq: Four times a day (QID) | RESPIRATORY_TRACT | 1 refills | Status: DC | PRN

## 2019-04-22 NOTE — Assessment & Plan Note (Signed)
Hepatitis Screen today

## 2019-04-22 NOTE — Addendum Note (Signed)
Addended by: Gilles Chiquito B on: 04/22/2019 04:24 PM   Modules accepted: Level of Service

## 2019-04-22 NOTE — Assessment & Plan Note (Signed)
Patient reports a history of intermitted trigeminal neuralgia preceding /during viral illnesses. The pain is not severe and only occurs episodically during these illnesses. - Continue to monitor

## 2019-04-22 NOTE — Progress Notes (Signed)
CC: Exercise induced asthma, Hyperlipidemia, Elevated blood pressure reading, Palpitation, Trigeminal Neuralgia, Establish care, Preventative health care.    HPI:  Mr.Michael Oneal is a 63 y.o. M with PMHx listed below presenting for Exercise induced asthma, Hyperlipidemia, Elevated blood pressure reading, Palpitation, Trigeminal Neuralgia, Establish care, Preventative health care.. Please see the A&P for the status of the patient's chronic medical problems.  Past Medical History:  Diagnosis Date  . Allergy   . Asthma   . Hyperlipidemia   . Right hamstring muscle strain 10/10/2014     Past Surgical History:  Procedure Laterality Date  . MOUTH SURGERY      Social History   Socioeconomic History  . Marital status: Married    Spouse name: Not on file  . Number of children: Not on file  . Years of education: Not on file  . Highest education level: Not on file  Occupational History  . Not on file  Social Needs  . Financial resource strain: Not on file  . Food insecurity    Worry: Not on file    Inability: Not on file  . Transportation needs    Medical: Not on file    Non-medical: Not on file  Tobacco Use  . Smoking status: Never Smoker  . Smokeless tobacco: Never Used  Substance and Sexual Activity  . Alcohol use: Yes    Alcohol/week: 7.0 standard drinks    Types: 7 Standard drinks or equivalent per week  . Drug use: No  . Sexual activity: Never  Lifestyle  . Physical activity    Days per week: Not on file    Minutes per session: Not on file  . Stress: Not on file  Relationships  . Social Herbalist on phone: Not on file    Gets together: Not on file    Attends religious service: Not on file    Active member of club or organization: Not on file    Attends meetings of clubs or organizations: Not on file    Relationship status: Not on file  . Intimate partner violence    Fear of current or ex partner: Not on file    Emotionally abused: Not on file   Physically abused: Not on file    Forced sexual activity: Not on file  Other Topics Concern  . Not on file  Social History Narrative  . Not on file   Family History  Problem Relation Age of Onset  . Heart disease Mother   . Hyperlipidemia Mother   . Heart disease Father   . Heart attack Father    Review of Systems:  Performed and all others negative.  Physical Exam:  Vitals:   04/22/19 1327  BP: 131/83  Pulse: 61  Temp: 98 F (36.7 C)  TempSrc: Oral  SpO2: 97%  Weight: 153 lb 14.4 oz (69.8 kg)  Height: 5\' 11"  (1.803 m)   Physical Exam Constitutional:      General: He is not in acute distress.    Appearance: Normal appearance.  Cardiovascular:     Rate and Rhythm: Normal rate and regular rhythm.     Pulses: Normal pulses.     Heart sounds: Normal heart sounds.  Pulmonary:     Effort: Pulmonary effort is normal. No respiratory distress.     Breath sounds: Normal breath sounds.  Abdominal:     General: Bowel sounds are normal. There is no distension.     Palpations: Abdomen is soft.  Tenderness: There is no abdominal tenderness.  Musculoskeletal:        General: No swelling or deformity.  Skin:    General: Skin is warm and dry.  Neurological:     General: No focal deficit present.     Mental Status: Mental status is at baseline.    Assessment & Plan:   See Encounters Tab for problem based charting.   Patient discussed with Dr. Daryll Drown

## 2019-04-22 NOTE — Assessment & Plan Note (Signed)
Mildly elevated BP readings at work and in our office to the low [id number] systolic. We will continue to monitor for now as it is only mildly elevated and he is not currently on any daily prescription medications. - Continue to monitor

## 2019-04-22 NOTE — Assessment & Plan Note (Addendum)
Patient reports rare palpitations while running. No symptoms beyond the sensation of rapid then slow heart rate. He has no EKG in our system, will obtain a baseline EKG. - EKG Shows sinus bradycardia (48 BPM)

## 2019-04-22 NOTE — Patient Instructions (Addendum)
Thank you for allowing [place] to care for you  Refill of albuterol provided  EKG performed, which showed normal rhythm with a below normal rate (likely due to your regular physical activity)  Hepatitis C screen today  Please follow up in about 1 year or sooner if needed

## 2019-04-22 NOTE — Assessment & Plan Note (Signed)
Patient reports a history of exercise induced asthma. His symptom were more frequent in the past, but only occur when he exercises in cold weather. His albuterol inhaler is about 63 years old, so a refill will be provided. - Albuterol PRN, and before exercise in the cold

## 2019-04-22 NOTE — Progress Notes (Signed)
Internal Medicine Clinic Attending  Case discussed with Dr. Melvin  at the time of the visit.  We reviewed the resident's history and exam and pertinent patient test results.  I agree with the assessment, diagnosis, and plan of care documented in the resident's note.  

## 2019-04-22 NOTE — Assessment & Plan Note (Signed)
Patient reports a history of mild hyperlipidemia since his 21s (Total Cholesterol up to 240s). He has not seen a significant increase since that time. He has had laps checked at work, which he will have sent to [place]. He exercises daily and avoids red meat. - Continue to monitor - Obtain outside records

## 2019-04-23 ENCOUNTER — Encounter: Payer: Self-pay | Admitting: Internal Medicine

## 2019-04-23 LAB — HEPATITIS C ANTIBODY: Hep C Virus Ab: <0.1 s/co ratio (ref 0.0–0.9)

## 2019-04-23 NOTE — Progress Notes (Signed)
Patient mailed letter informing him of normal test results.

## 2019-05-03 DIAGNOSIS — Z1159 Encounter for screening for other viral diseases: Secondary | ICD-10-CM | POA: Diagnosis not present

## 2019-05-06 DIAGNOSIS — Z86010 Personal history of colonic polyps: Secondary | ICD-10-CM | POA: Diagnosis not present

## 2019-12-04 ENCOUNTER — Encounter: Payer: Self-pay | Admitting: Emergency Medicine

## 2019-12-04 ENCOUNTER — Ambulatory Visit
Admission: EM | Admit: 2019-12-04 | Discharge: 2019-12-04 | Disposition: A | Discharge status: Home / Self Care | Payer: BC Managed Care – PPO

## 2019-12-04 ENCOUNTER — Ambulatory Visit (HOSPITAL_COMMUNITY)
Admission: EM | Admit: 2019-12-04 | Discharge: 2019-12-04 | Discharge status: Against Medical Advice | Payer: BC Managed Care – PPO

## 2019-12-04 ENCOUNTER — Telehealth: Payer: Self-pay | Admitting: Emergency Medicine

## 2019-12-04 ENCOUNTER — Other Ambulatory Visit: Payer: Self-pay

## 2019-12-04 DIAGNOSIS — M79642 Pain in left hand: Secondary | ICD-10-CM | POA: Diagnosis not present

## 2019-12-04 DIAGNOSIS — Z23 Encounter for immunization: Secondary | ICD-10-CM | POA: Diagnosis not present

## 2019-12-04 DIAGNOSIS — W540XXA Bitten by dog, initial encounter: Secondary | ICD-10-CM

## 2019-12-04 MED ORDER — TETANUS-DIPHTH-ACELL PERTUSSIS 5-2.5-18.5 LF-MCG/0.5 IM SUSP
0.5000 mL | Freq: Once | INTRAMUSCULAR | Status: AC
  Administered 2019-12-04: 0.5 mL via INTRAMUSCULAR

## 2019-12-04 MED ORDER — AMOXICILLIN-POT CLAVULANATE 875-125 MG PO TABS: 1.0000 | ORAL_TABLET | Freq: Two times a day (BID) | ORAL | 0 refills | Status: DC

## 2019-12-04 NOTE — ED Triage Notes (Addendum)
Dog bite occurred last night around 5:30 pm  Patient has puncture wounds to left hand, palm and back of hand.  Patient cleansed wounds last night.  Palm bite is still oozing blood tinged fluid.  Back of hand looks swollen.  Patient is able to make a fist with left hand, but is sore to do so.  Patient is able to move fingers  Patient has had covid vaccine  Patient has rabies reports for this dog.  This is neighbors dog

## 2019-12-04 NOTE — ED Provider Notes (Signed)
[facility]    CSN: [id number] Arrival date & time: 12/04/19  1055      History   Chief Complaint Chief Complaint  Patient presents with  . Animal Bite    HPI Michael Oneal is a 64 y.o. male with history of allergies, asthma presenting for dog bite to left hand.  States this occurred last night around 5:30 PM.  States it was a neighbor's dog that was approximately 45 pounds.  Endorsing some pain and swelling, though range of motion has improved.  Denying fever, chills, arthralgias, myalgias.  No discharge.  Patient has received Covid vaccine.  States that neighbors dog is up-to-date on rabies vaccination.  Was able to clean hand at home.   Past Medical History:  Diagnosis Date  . Allergy   . Asthma   . Hyperlipidemia   . Right hamstring muscle strain 10/10/2014    Patient Active Problem List   Diagnosis Date Noted  . Hyperlipemia 04/22/2019  . Elevated blood pressure reading 04/22/2019  . Trigeminal neuralgia 04/22/2019  . Exercise-induced asthma 04/22/2019  . Palpitation 04/22/2019  . Preventative health care 04/22/2019    Past Surgical History:  Procedure Laterality Date  . MOUTH SURGERY         Home Medications    Prior to Admission medications   Medication Sig Start Date End Date Taking? Authorizing Provider  aspirin EC 81 MG tablet Take 81 mg by mouth daily.   Yes [provider]  fexofenadine (ALLEGRA) 180 MG tablet Take 180 mg by mouth daily.   Yes [provider]  albuterol (VENTOLIN HFA) 108 (90 Base) MCG/ACT inhaler Inhale 2 puffs into the lungs every 6 (six) hours as needed for wheezing or shortness of breath. 04/22/19   Neva Seat, MD  amoxicillin-clavulanate (AUGMENTIN) 875-125 MG tablet Take 1 tablet by mouth every 12 (twelve) hours. 12/04/19   Hall-Potvin, [place], PA-C  Omega-3 Fatty Acids (FISH OIL) 1000 MG CAPS Take 1,000 mg by mouth.    [provider]    Family History Family History  Problem  Relation Age of Onset  . Heart disease Mother   . Hyperlipidemia Mother   . Heart disease Father   . Heart attack Father     Social History Social History   Tobacco Use  . Smoking status: Never Smoker  . Smokeless tobacco: Never Used  Substance Use Topics  . Alcohol use: Yes    Alcohol/week: 7.0 standard drinks    Types: 7 Standard drinks or equivalent per week  . Drug use: No     Allergies   Patient has no known allergies.   Review of Systems As per HPI   Physical Exam Triage Vital Signs ED Triage Vitals  Enc Vitals Group     BP      Pulse      Resp      Temp      Temp src      SpO2      Weight      Height      Head Circumference      Peak Flow      Pain Score      Pain Loc      Pain Edu?      Excl. in [place]?    No data found.  Updated Vital Signs BP (!) 149/90 (BP Location: Left Arm)   Pulse (!) 47   Temp 98 F (36.7 C) (Oral)   Resp 16   SpO2  99%   Visual Acuity Right Eye Distance:   Left Eye Distance:   Bilateral Distance:    Right Eye Near:   Left Eye Near:    Bilateral Near:     Physical Exam Constitutional:      General: He is not in acute distress. HENT:     Head: Normocephalic and atraumatic.  Eyes:     General: No scleral icterus.    Pupils: Pupils are equal, round, and reactive to light.  Cardiovascular:     Rate and Rhythm: Regular rhythm. Bradycardia present.  Pulmonary:     Effort: Pulmonary effort is normal. No respiratory distress.     Breath sounds: No wheezing.  Musculoskeletal:        General: Swelling and tenderness present. Normal range of motion.     Comments: Mild swelling in left hand, proximal aspect, around puncture wounds.  NVI  Skin:    Coloration: Skin is not jaundiced or pale.     Comments: 3 punctate wounds noted in left hand: 2 on dorsal aspect, large ventral.  No foreign body, discharge, bleeding, warmth.  Mild TTP.  Neurological:     Mental Status: He is alert and oriented to person, place, and time.       UC Treatments / Results  Labs (all labs ordered are listed, but only abnormal results are displayed) Labs Reviewed - No data to display  EKG   Radiology No results found.  Procedures Procedures (including critical care time)  Medications Ordered in UC Medications  Tdap (BOOSTRIX) injection 0.5 mL (0.5 mLs Intramuscular Given 12/04/19 1200)    Initial Impression / Assessment and Plan / UC Course  I have reviewed the triage vital signs and the nursing notes.  Pertinent labs & imaging results that were available during my care of the patient were reviewed by me and considered in my medical decision making (see chart for details).     Patient afebrile, nontoxic in office today.  Tetanus updated.  Will start Augmentin.  Patient declined hand x-ray to rule out concurrent fracture.  Return precautions discussed, patient verbalized understanding and is agreeable to plan. Final Clinical Impressions(s) / UC Diagnoses   Final diagnoses:  Left hand pain  Dog bite, initial encounter     Discharge Instructions     Take antibiotic twice daily with food. Keep wound clean and dry. Return for worsening pain, swelling, numbness, fever.    ED Prescriptions    Medication Sig Dispense Auth. Provider   amoxicillin-clavulanate (AUGMENTIN) 875-125 MG tablet Take 1 tablet by mouth every 12 (twelve) hours. 14 tablet Hall-Potvin, [place], PA-C     PDMP not reviewed this encounter.   Hall-Potvin, [place], [place] 12/05/19 [phone]

## 2019-12-04 NOTE — Discharge Instructions (Signed)
Take antibiotic twice daily with food. Keep wound clean and dry. Return for worsening pain, swelling, numbness, fever.

## 2020-03-09 DIAGNOSIS — L720 Epidermal cyst: Secondary | ICD-10-CM | POA: Diagnosis not present

## 2020-03-09 DIAGNOSIS — L219 Seborrheic dermatitis, unspecified: Secondary | ICD-10-CM | POA: Diagnosis not present

## 2020-03-09 DIAGNOSIS — D485 Neoplasm of uncertain behavior of skin: Secondary | ICD-10-CM | POA: Diagnosis not present

## 2020-04-10 DIAGNOSIS — D225 Melanocytic nevi of trunk: Secondary | ICD-10-CM | POA: Diagnosis not present

## 2020-04-10 DIAGNOSIS — L821 Other seborrheic keratosis: Secondary | ICD-10-CM | POA: Diagnosis not present

## 2020-04-10 DIAGNOSIS — Z85828 Personal history of other malignant neoplasm of skin: Secondary | ICD-10-CM | POA: Diagnosis not present

## 2020-04-10 DIAGNOSIS — L814 Other melanin hyperpigmentation: Secondary | ICD-10-CM | POA: Diagnosis not present

## 2020-05-31 ENCOUNTER — Encounter: Payer: Self-pay | Admitting: Family Medicine

## 2020-05-31 ENCOUNTER — Ambulatory Visit: Payer: BC Managed Care – PPO | Admitting: Family Medicine

## 2020-05-31 ENCOUNTER — Other Ambulatory Visit: Payer: Self-pay

## 2020-05-31 VITALS — BP 118/82 | Ht 71.0 in | Wt 152.0 lb

## 2020-05-31 DIAGNOSIS — S76312A Strain of muscle, fascia and tendon of the posterior muscle group at thigh level, left thigh, initial encounter: Secondary | ICD-10-CM

## 2020-05-31 DIAGNOSIS — M7918 Myalgia, other site: Secondary | ICD-10-CM | POA: Diagnosis not present

## 2020-05-31 NOTE — Patient Instructions (Signed)
Overall your exam is reassuring but you're having issues with your hip external rotators especially the deeper ones like the coccygeus. Start physical therapy for this and for hamstring even though this is doing better than usual. Do home exercises and stretches on days you don't go to therapy. Ice or heat (whichever feels better at this point) 15 minutes at a time as needed. Compression is difficult in this area. Tylenol, Aleve only if needed Follow up with me in 5-6 weeks for reevaluation.

## 2020-05-31 NOTE — Progress Notes (Signed)
SUBJECTIVE:   CHIEF COMPLAINT / HPI:   Left hip/gluteal pain: Patient is a 64 year old male that presents today for left hamstring/glute pain. Patient states that he had a hamstring pull about 1 or 2 years ago on his left side and ended up taking about 6 months off from running about 1 year ago with no improvement.  He says since then he has been increasing his running and on last Saturday he experienced some soreness in his left hamstring and left gluteal area.  He states that this morning he went for a run and started off with 2 miles easy but try to increase his pace for the last 3 miles and started to experience some pain in his left gluteal area.  He states his pain got to the point that he was almost walking when the last mile occurred.  He denies any specific injury or popping sensation when this occurred.  He denies any pain shooting down his legs.  He states that he has difficulty recreating the pain with specific stretches but he can sometimes palpate it.  OBJECTIVE:   BP 118/82    Ht 5\' 11"  (1.803 m)    Wt 152 lb (68.9 kg)    BMI 21.20 kg/m    Left gluteal/hamstring: No bruising or erythema present to the bulk of the hamstring, no tenderness to palpation of the insertion of the hamstring tendons.  Patient with some discomfort on heavy palpation to the bulk of the hamstring musculature.  Patient experiences some discomfort on palpation medial to the ischial tuberosity.  Strength testing: 5/5 in knee flexion and knee extension from a seated bilaterally, 5/5 in hip flexion on the right, 4/5 in hip flexion on the left from seated.  When lying prone patient has some discomfort in his hamstring musculature with strength testing on the left.  Only minimal discomfort on FABER testing of the left.  ASSESSMENT/PLAN:   Left gluteal/hamstring pain: Overall differential for this 64 year old gentleman with left hamstring and gluteal pain includes tendinopathy, musculoskeletal overuse, or minor  muscular injury.  Patient strength testing is intact which makes significant muscular injury unlikely, this is further supported by the fact the patient does not remember any acute injury to the area and the fact that this discomfort seemed to come on insidiously as the patient picked up his running distance and speed.  The fact that the patient does endorse a somewhat unique gait on the left side due to previous hip flexor tightness supports that this may be an overuse issue.  The patient's main area of location for discomfort is in the medial aspect of the left gluteal musculature, just lateral to the coccyx.  The patient does have a little bit of discomfort on Corky Sox but does not experience any significant pain in the bulk of his gluteus maximus or gluteus medius.  Patient does have some ongoing hamstring discomfort but this is not the area of his current complaint.  Patient has tried some stretching and strengthening exercises at home but is interested in formal physical therapy to learn some more advanced exercises. Plan: -We will start physical therapy exercises to strengthen hip external rotators as well as hamstrings particularly on the left, recommended patient continue with ice and heat as well as compression.  Plan for follow-up in 5 to 6 weeks for reevaluation.  Lurline Del, [place]    This note was prepared using Dragon voice recognition software and may include unintentional dictation errors  due to the inherent limitations of voice recognition software.

## 2020-05-31 NOTE — Addendum Note (Signed)
Addended by: Dene Gentry on: 05/31/2020 01:17 PM   Modules accepted: Level of Service

## 2020-06-14 DIAGNOSIS — R262 Difficulty in walking, not elsewhere classified: Secondary | ICD-10-CM | POA: Diagnosis not present

## 2020-06-14 DIAGNOSIS — M25552 Pain in left hip: Secondary | ICD-10-CM | POA: Diagnosis not present

## 2020-06-19 DIAGNOSIS — M25552 Pain in left hip: Secondary | ICD-10-CM | POA: Diagnosis not present

## 2020-06-19 DIAGNOSIS — R262 Difficulty in walking, not elsewhere classified: Secondary | ICD-10-CM | POA: Diagnosis not present

## 2020-06-21 DIAGNOSIS — R262 Difficulty in walking, not elsewhere classified: Secondary | ICD-10-CM | POA: Diagnosis not present

## 2020-06-21 DIAGNOSIS — M25552 Pain in left hip: Secondary | ICD-10-CM | POA: Diagnosis not present

## 2020-06-26 DIAGNOSIS — R262 Difficulty in walking, not elsewhere classified: Secondary | ICD-10-CM | POA: Diagnosis not present

## 2020-06-26 DIAGNOSIS — M25552 Pain in left hip: Secondary | ICD-10-CM | POA: Diagnosis not present

## 2020-07-04 DIAGNOSIS — M25552 Pain in left hip: Secondary | ICD-10-CM | POA: Diagnosis not present

## 2020-07-04 DIAGNOSIS — R262 Difficulty in walking, not elsewhere classified: Secondary | ICD-10-CM | POA: Diagnosis not present

## 2020-07-10 DIAGNOSIS — M25552 Pain in left hip: Secondary | ICD-10-CM | POA: Diagnosis not present

## 2020-07-10 DIAGNOSIS — R262 Difficulty in walking, not elsewhere classified: Secondary | ICD-10-CM | POA: Diagnosis not present

## 2020-08-02 DIAGNOSIS — R262 Difficulty in walking, not elsewhere classified: Secondary | ICD-10-CM | POA: Diagnosis not present

## 2020-08-02 DIAGNOSIS — M25552 Pain in left hip: Secondary | ICD-10-CM | POA: Diagnosis not present

## 2020-08-07 DIAGNOSIS — M25552 Pain in left hip: Secondary | ICD-10-CM | POA: Diagnosis not present

## 2020-08-07 DIAGNOSIS — R262 Difficulty in walking, not elsewhere classified: Secondary | ICD-10-CM | POA: Diagnosis not present

## 2020-08-14 DIAGNOSIS — R262 Difficulty in walking, not elsewhere classified: Secondary | ICD-10-CM | POA: Diagnosis not present

## 2020-08-14 DIAGNOSIS — M25552 Pain in left hip: Secondary | ICD-10-CM | POA: Diagnosis not present

## 2021-01-16 ENCOUNTER — Encounter: Payer: Self-pay | Admitting: *Deleted

## 2022-05-04 DIAGNOSIS — S2241XA Multiple fractures of ribs, right side, initial encounter for closed fracture: Secondary | ICD-10-CM | POA: Diagnosis not present

## 2022-05-04 DIAGNOSIS — S22048A Other fracture of fourth thoracic vertebra, initial encounter for closed fracture: Secondary | ICD-10-CM | POA: Diagnosis not present

## 2022-05-04 DIAGNOSIS — S270XXA Traumatic pneumothorax, initial encounter: Secondary | ICD-10-CM | POA: Diagnosis not present

## 2022-05-04 DIAGNOSIS — S42021A Displaced fracture of shaft of right clavicle, initial encounter for closed fracture: Secondary | ICD-10-CM | POA: Diagnosis not present

## 2022-05-04 DIAGNOSIS — S27321A Contusion of lung, unilateral, initial encounter: Secondary | ICD-10-CM | POA: Diagnosis not present

## 2022-05-04 DIAGNOSIS — S3991XA Unspecified injury of abdomen, initial encounter: Secondary | ICD-10-CM | POA: Diagnosis not present

## 2022-05-04 DIAGNOSIS — S299XXA Unspecified injury of thorax, initial encounter: Secondary | ICD-10-CM | POA: Diagnosis not present

## 2022-05-04 DIAGNOSIS — Y9355 Activity, bike riding: Secondary | ICD-10-CM | POA: Diagnosis not present

## 2022-05-04 DIAGNOSIS — S2222XA Fracture of body of sternum, initial encounter for closed fracture: Secondary | ICD-10-CM | POA: Diagnosis not present

## 2022-05-04 DIAGNOSIS — M25511 Pain in right shoulder: Secondary | ICD-10-CM | POA: Diagnosis not present

## 2022-05-04 DIAGNOSIS — S199XXA Unspecified injury of neck, initial encounter: Secondary | ICD-10-CM | POA: Diagnosis not present

## 2022-05-04 DIAGNOSIS — S22038A Other fracture of third thoracic vertebra, initial encounter for closed fracture: Secondary | ICD-10-CM | POA: Diagnosis not present

## 2022-05-04 DIAGNOSIS — S42001D Fracture of unspecified part of right clavicle, subsequent encounter for fracture with routine healing: Secondary | ICD-10-CM | POA: Diagnosis not present

## 2022-05-04 DIAGNOSIS — J939 Pneumothorax, unspecified: Secondary | ICD-10-CM

## 2022-05-04 DIAGNOSIS — S22009A Unspecified fracture of unspecified thoracic vertebra, initial encounter for closed fracture: Secondary | ICD-10-CM | POA: Diagnosis not present

## 2022-05-04 DIAGNOSIS — M7731 Calcaneal spur, right foot: Secondary | ICD-10-CM | POA: Diagnosis not present

## 2022-05-04 DIAGNOSIS — M546 Pain in thoracic spine: Secondary | ICD-10-CM | POA: Diagnosis not present

## 2022-05-04 DIAGNOSIS — W2209XA Striking against other stationary object, initial encounter: Secondary | ICD-10-CM | POA: Diagnosis not present

## 2022-05-04 DIAGNOSIS — S42001A Fracture of unspecified part of right clavicle, initial encounter for closed fracture: Secondary | ICD-10-CM | POA: Diagnosis not present

## 2022-05-04 DIAGNOSIS — M25572 Pain in left ankle and joints of left foot: Secondary | ICD-10-CM | POA: Diagnosis not present

## 2022-05-04 DIAGNOSIS — S22058A Other fracture of T5-T6 vertebra, initial encounter for closed fracture: Secondary | ICD-10-CM | POA: Diagnosis not present

## 2022-05-04 DIAGNOSIS — S2220XA Unspecified fracture of sternum, initial encounter for closed fracture: Secondary | ICD-10-CM | POA: Diagnosis not present

## 2022-05-04 DIAGNOSIS — S22028A Other fracture of second thoracic vertebra, initial encounter for closed fracture: Secondary | ICD-10-CM | POA: Diagnosis not present

## 2022-05-04 DIAGNOSIS — T1490XA Injury, unspecified, initial encounter: Secondary | ICD-10-CM | POA: Diagnosis not present

## 2022-05-04 DIAGNOSIS — S22068A Other fracture of T7-T8 thoracic vertebra, initial encounter for closed fracture: Secondary | ICD-10-CM | POA: Diagnosis not present

## 2022-05-04 DIAGNOSIS — S0990XA Unspecified injury of head, initial encounter: Secondary | ICD-10-CM | POA: Diagnosis not present

## 2022-05-04 DIAGNOSIS — M25571 Pain in right ankle and joints of right foot: Secondary | ICD-10-CM | POA: Diagnosis not present

## 2022-05-04 HISTORY — DX: Pneumothorax, unspecified: J93.9

## 2022-05-07 DIAGNOSIS — S2220XA Unspecified fracture of sternum, initial encounter for closed fracture: Secondary | ICD-10-CM | POA: Diagnosis not present

## 2022-05-07 DIAGNOSIS — S42001A Fracture of unspecified part of right clavicle, initial encounter for closed fracture: Secondary | ICD-10-CM | POA: Diagnosis not present

## 2022-05-07 DIAGNOSIS — S2249XA Multiple fractures of ribs, unspecified side, initial encounter for closed fracture: Secondary | ICD-10-CM | POA: Diagnosis not present

## 2022-05-09 DIAGNOSIS — S42021D Displaced fracture of shaft of right clavicle, subsequent encounter for fracture with routine healing: Secondary | ICD-10-CM | POA: Diagnosis not present

## 2022-05-15 ENCOUNTER — Other Ambulatory Visit: Payer: Self-pay

## 2022-05-15 ENCOUNTER — Encounter (HOSPITAL_COMMUNITY): Payer: Self-pay | Admitting: Orthopedic Surgery
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
## 2022-05-16 ENCOUNTER — Ambulatory Visit (HOSPITAL_COMMUNITY)
Admission: RE | Admit: 2022-05-16 | Discharge: 2022-05-16 | Disposition: A | Discharge status: Home / Self Care | Payer: BC Managed Care – PPO | Attending: Orthopedic Surgery | Admitting: Orthopedic Surgery

## 2022-05-16 ENCOUNTER — Other Ambulatory Visit: Payer: Self-pay

## 2022-05-16 ENCOUNTER — Encounter (HOSPITAL_COMMUNITY)
Admission: RE | Disposition: A | Discharge status: Home / Self Care | Payer: Self-pay | Source: Home / Self Care | Attending: Orthopedic Surgery

## 2022-05-16 ENCOUNTER — Encounter (HOSPITAL_COMMUNITY): Payer: Self-pay | Admitting: Orthopedic Surgery

## 2022-05-16 ENCOUNTER — Ambulatory Visit (HOSPITAL_COMMUNITY): Payer: BC Managed Care – PPO

## 2022-05-16 ENCOUNTER — Ambulatory Visit (HOSPITAL_COMMUNITY): Payer: BC Managed Care – PPO | Admitting: Physician Assistant

## 2022-05-16 DIAGNOSIS — S42021A Displaced fracture of shaft of right clavicle, initial encounter for closed fracture: Secondary | ICD-10-CM | POA: Diagnosis not present

## 2022-05-16 DIAGNOSIS — K449 Diaphragmatic hernia without obstruction or gangrene: Secondary | ICD-10-CM | POA: Insufficient documentation

## 2022-05-16 DIAGNOSIS — Y9355 Activity, bike riding: Secondary | ICD-10-CM | POA: Insufficient documentation

## 2022-05-16 DIAGNOSIS — J45909 Unspecified asthma, uncomplicated: Secondary | ICD-10-CM | POA: Insufficient documentation

## 2022-05-16 DIAGNOSIS — S42021D Displaced fracture of shaft of right clavicle, subsequent encounter for fracture with routine healing: Secondary | ICD-10-CM | POA: Diagnosis not present

## 2022-05-16 HISTORY — DX: Trigeminal neuralgia: G50.0

## 2022-05-16 HISTORY — DX: Personal history of other diseases of the digestive system: Z87.19

## 2022-05-16 HISTORY — PX: ORIF CLAVICULAR FRACTURE: SHX5055

## 2022-05-16 HISTORY — DX: Unspecified malignant neoplasm of skin, unspecified: C44.90

## 2022-05-16 HISTORY — DX: Palpitations: R00.2

## 2022-05-16 SURGERY — OPEN REDUCTION INTERNAL FIXATION (ORIF) CLAVICULAR FRACTURE
Anesthesia: General | Laterality: Right

## 2022-05-16 MED ORDER — LIDOCAINE HCL (PF) 2 % IJ SOLN
INTRAMUSCULAR | Status: AC
  Filled 2022-05-16: qty 5

## 2022-05-16 MED ORDER — FENTANYL CITRATE (PF) 100 MCG/2ML IJ SOLN
INTRAMUSCULAR | Status: DC | PRN
  Administered 2022-05-16 (×2): 50 ug via INTRAVENOUS

## 2022-05-16 MED ORDER — FENTANYL CITRATE (PF) 100 MCG/2ML IJ SOLN
INTRAMUSCULAR | Status: AC
  Filled 2022-05-16: qty 2

## 2022-05-16 MED ORDER — DEXAMETHASONE SODIUM PHOSPHATE 10 MG/ML IJ SOLN
INTRAMUSCULAR | Status: AC
  Filled 2022-05-16: qty 1

## 2022-05-16 MED ORDER — KETAMINE HCL 10 MG/ML IJ SOLN
INTRAMUSCULAR | Status: AC
  Filled 2022-05-16: qty 1

## 2022-05-16 MED ORDER — MIDAZOLAM HCL 2 MG/2ML IJ SOLN
INTRAMUSCULAR | Status: AC
  Filled 2022-05-16: qty 2

## 2022-05-16 MED ORDER — HYDROCODONE-ACETAMINOPHEN 5-325 MG PO TABS: 1.0000 | ORAL_TABLET | ORAL | 0 refills | Status: DC | PRN

## 2022-05-16 MED ORDER — PROPOFOL 10 MG/ML IV BOLUS
INTRAVENOUS | Status: DC | PRN
  Administered 2022-05-16: 140 mg via INTRAVENOUS

## 2022-05-16 MED ORDER — FENTANYL CITRATE PF 50 MCG/ML IJ SOSY
PREFILLED_SYRINGE | INTRAMUSCULAR | Status: AC
  Filled 2022-05-16: qty 1

## 2022-05-16 MED ORDER — ONDANSETRON HCL 4 MG PO TABS: 4.0000 mg | ORAL_TABLET | Freq: Three times a day (TID) | ORAL | 0 refills | Status: DC | PRN

## 2022-05-16 MED ORDER — VANCOMYCIN HCL 1000 MG IV SOLR
INTRAVENOUS | Status: DC | PRN
  Administered 2022-05-16: 1000 mg via TOPICAL

## 2022-05-16 MED ORDER — SUGAMMADEX SODIUM 200 MG/2ML IV SOLN
INTRAVENOUS | Status: DC | PRN
  Administered 2022-05-16: 130 mg via INTRAVENOUS

## 2022-05-16 MED ORDER — ONDANSETRON HCL 4 MG/2ML IJ SOLN
INTRAMUSCULAR | Status: DC | PRN
  Administered 2022-05-16: 4 mg via INTRAVENOUS

## 2022-05-16 MED ORDER — PHENYLEPHRINE HCL-NACL 20-0.9 MG/250ML-% IV SOLN
INTRAVENOUS | Status: DC | PRN
  Administered 2022-05-16: 50 ug/min via INTRAVENOUS

## 2022-05-16 MED ORDER — LACTATED RINGERS IV SOLN: INTRAVENOUS | Status: DC

## 2022-05-16 MED ORDER — PHENYLEPHRINE 80 MCG/ML (10ML) SYRINGE FOR IV PUSH (FOR BLOOD PRESSURE SUPPORT)
PREFILLED_SYRINGE | INTRAVENOUS | Status: DC | PRN
  Administered 2022-05-16: 120 ug via INTRAVENOUS
  Administered 2022-05-16: 80 ug via INTRAVENOUS

## 2022-05-16 MED ORDER — MIDAZOLAM HCL 2 MG/2ML IJ SOLN
INTRAMUSCULAR | Status: DC | PRN
  Administered 2022-05-16: 2 mg via INTRAVENOUS

## 2022-05-16 MED ORDER — CHLORHEXIDINE GLUCONATE 0.12 % MT SOLN
15.0000 mL | Freq: Once | OROMUCOSAL | Status: AC
  Administered 2022-05-16: 15 mL via OROMUCOSAL

## 2022-05-16 MED ORDER — PROPOFOL 10 MG/ML IV BOLUS
INTRAVENOUS | Status: AC
  Filled 2022-05-16: qty 20

## 2022-05-16 MED ORDER — LIDOCAINE 2% (20 MG/ML) 5 ML SYRINGE
INTRAMUSCULAR | Status: DC | PRN
  Administered 2022-05-16: 40 mg via INTRAVENOUS

## 2022-05-16 MED ORDER — BUPIVACAINE-EPINEPHRINE (PF) 0.5% -1:200000 IJ SOLN
INTRAMUSCULAR | Status: AC
  Filled 2022-05-16: qty 30

## 2022-05-16 MED ORDER — ROCURONIUM BROMIDE 10 MG/ML (PF) SYRINGE
PREFILLED_SYRINGE | INTRAVENOUS | Status: DC | PRN
  Administered 2022-05-16: 50 mg via INTRAVENOUS
  Administered 2022-05-16: 10 mg via INTRAVENOUS

## 2022-05-16 MED ORDER — 0.9 % SODIUM CHLORIDE (POUR BTL) OPTIME
TOPICAL | Status: DC | PRN
  Administered 2022-05-16: 1000 mL

## 2022-05-16 MED ORDER — BUPIVACAINE-EPINEPHRINE (PF) 0.5% -1:200000 IJ SOLN
INTRAMUSCULAR | Status: DC | PRN
  Administered 2022-05-16: 15 mL

## 2022-05-16 MED ORDER — OXYCODONE HCL 5 MG/5ML PO SOLN: 5.0000 mg | Freq: Once | ORAL | Status: DC | PRN

## 2022-05-16 MED ORDER — CEFAZOLIN SODIUM-DEXTROSE 2-4 GM/100ML-% IV SOLN
2.0000 g | INTRAVENOUS | Status: AC
  Administered 2022-05-16: 2 g via INTRAVENOUS
  Filled 2022-05-16: qty 100

## 2022-05-16 MED ORDER — OXYCODONE HCL 5 MG PO TABS: 5.0000 mg | ORAL_TABLET | Freq: Once | ORAL | Status: DC | PRN

## 2022-05-16 MED ORDER — ROCURONIUM BROMIDE 10 MG/ML (PF) SYRINGE
PREFILLED_SYRINGE | INTRAVENOUS | Status: AC
  Filled 2022-05-16: qty 10

## 2022-05-16 MED ORDER — KETAMINE HCL 10 MG/ML IJ SOLN
INTRAMUSCULAR | Status: DC | PRN
  Administered 2022-05-16: 5 mg via INTRAVENOUS
  Administered 2022-05-16 (×2): 10 mg via INTRAVENOUS

## 2022-05-16 MED ORDER — ORAL CARE MOUTH RINSE: 15.0000 mL | Freq: Once | OROMUCOSAL | Status: AC

## 2022-05-16 MED ORDER — ONDANSETRON HCL 4 MG/2ML IJ SOLN
INTRAMUSCULAR | Status: AC
  Filled 2022-05-16: qty 2

## 2022-05-16 MED ORDER — EPHEDRINE SULFATE-NACL 50-0.9 MG/10ML-% IV SOSY
PREFILLED_SYRINGE | INTRAVENOUS | Status: DC | PRN
  Administered 2022-05-16: 5 mg via INTRAVENOUS
  Administered 2022-05-16: 10 mg via INTRAVENOUS

## 2022-05-16 MED ORDER — ACETAMINOPHEN 500 MG PO TABS: 1000.0000 mg | ORAL_TABLET | Freq: Once | ORAL | Status: DC

## 2022-05-16 MED ORDER — CELECOXIB 200 MG PO CAPS: 200.0000 mg | ORAL_CAPSULE | Freq: Once | ORAL | Status: DC

## 2022-05-16 MED ORDER — KETOROLAC TROMETHAMINE 30 MG/ML IJ SOLN
INTRAMUSCULAR | Status: DC | PRN
  Administered 2022-05-16: 30 mg via INTRAVENOUS

## 2022-05-16 MED ORDER — VANCOMYCIN HCL 1000 MG IV SOLR
INTRAVENOUS | Status: AC
  Filled 2022-05-16: qty 20

## 2022-05-16 MED ORDER — DEXAMETHASONE SODIUM PHOSPHATE 10 MG/ML IJ SOLN
INTRAMUSCULAR | Status: DC | PRN
  Administered 2022-05-16: 4 mg via INTRAVENOUS

## 2022-05-16 MED ORDER — FENTANYL CITRATE PF 50 MCG/ML IJ SOSY
25.0000 ug | PREFILLED_SYRINGE | INTRAMUSCULAR | Status: DC | PRN
  Administered 2022-05-16: 50 ug via INTRAVENOUS

## 2022-05-16 SURGICAL SUPPLY — 48 items
BAG COUNTER SPONGE SURGICOUNT (BAG) IMPLANT
BAG ZIPLOCK 12X15 (MISCELLANEOUS) ×1 IMPLANT
BIT DRILL CLAV ALPS 2.7X145 (BIT) IMPLANT
COVER MAYO STAND STRL (DRAPES) ×1 IMPLANT
COVER SURGICAL LIGHT HANDLE (MISCELLANEOUS) ×1 IMPLANT
DRAPE C-ARM 42X120 X-RAY (DRAPES) ×1 IMPLANT
DRAPE INCISE IOBAN 66X45 STRL (DRAPES) ×1 IMPLANT
DRAPE ORTHO SPLIT 77X108 STRL (DRAPES) ×1
DRAPE SURG 17X11 SM STRL (DRAPES) ×1 IMPLANT
DRAPE SURG ORHT 6 SPLT 77X108 (DRAPES) ×1 IMPLANT
DRAPE U-SHAPE 47X51 STRL (DRAPES) ×1 IMPLANT
DRESSING AQUACEL AG SP 3.5X10 (GAUZE/BANDAGES/DRESSINGS) IMPLANT
DRSG AQUACEL AG SP 3.5X10 (GAUZE/BANDAGES/DRESSINGS) ×1
DRSG EMULSION OIL 3X16 NADH (GAUZE/BANDAGES/DRESSINGS) ×1 IMPLANT
DURAPREP 26ML APPLICATOR (WOUND CARE) ×1 IMPLANT
GAUZE SPONGE 4X4 12PLY STRL (GAUZE/BANDAGES/DRESSINGS) ×1 IMPLANT
GAUZE XEROFORM 1X8 LF (GAUZE/BANDAGES/DRESSINGS) ×1 IMPLANT
GLOVE BIO SURGEON STRL SZ7.5 (GLOVE) ×2 IMPLANT
GLOVE BIOGEL PI IND STRL 8 (GLOVE) ×1 IMPLANT
GLOVE BIOGEL PI IND STRL 8.5 (GLOVE) ×1 IMPLANT
GOWN STRL REUS W/ TWL XL LVL3 (GOWN DISPOSABLE) ×2 IMPLANT
GOWN STRL REUS W/TWL XL LVL3 (GOWN DISPOSABLE) ×2
K-WIRE TROCHAR TIP ALPS 1.6 (WIRE) ×2
KIT BASIN OR (CUSTOM PROCEDURE TRAY) ×1 IMPLANT
KWIRE TROCHAR TIP ALPS 1.6 (WIRE) IMPLANT
MANIFOLD NEPTUNE II (INSTRUMENTS) ×1 IMPLANT
NS IRRIG 1000ML POUR BTL (IV SOLUTION) ×1 IMPLANT
PACK SHOULDER (CUSTOM PROCEDURE TRAY) ×1 IMPLANT
PLATE CLAV SUP RT 8H 900 NS (Plate) IMPLANT
PROTECTOR NERVE ULNAR (MISCELLANEOUS) ×1 IMPLANT
SCREW CORT LP 3.5X14 (Screw) IMPLANT
SCREW CORT LP T15 3.5X16 (Screw) IMPLANT
SCREW LP NL T15 3.5X20 (Screw) IMPLANT
SCREW TIS LP 3.5X18 NS (Screw) IMPLANT
SLING ARM FOAM STRAP LRG (SOFTGOODS) IMPLANT
SLING ARM FOAM STRAP MED (SOFTGOODS) IMPLANT
STAPLER VISISTAT 35W (STAPLE) ×1 IMPLANT
STRIP CLOSURE SKIN 1/2X4 (GAUZE/BANDAGES/DRESSINGS) ×1 IMPLANT
SUCTION FRAZIER HANDLE 12FR (TUBING) ×1
SUCTION TUBE FRAZIER 12FR DISP (TUBING) ×1 IMPLANT
SUT ETHIBOND NAB CT1 #1 30IN (SUTURE) ×3 IMPLANT
SUT MNCRL AB 4-0 PS2 18 (SUTURE) ×1 IMPLANT
SUT VIC AB 1 CT1 27 (SUTURE) ×1
SUT VIC AB 1 CT1 27XBRD ANTBC (SUTURE) ×1 IMPLANT
SUT VIC AB 2-0 CT1 27 (SUTURE) ×1
SUT VIC AB 2-0 CT1 TAPERPNT 27 (SUTURE) ×1 IMPLANT
TOWEL OR 17X26 10 PK STRL BLUE (TOWEL DISPOSABLE) ×2 IMPLANT
WATER STERILE IRR 1000ML POUR (IV SOLUTION) ×1 IMPLANT

## 2022-05-16 NOTE — Transfer of Care (Signed)
Immediate Anesthesia Transfer of Care Note  Patient: Michael Oneal  Procedure(s) Performed: OPEN REDUCTION INTERNAL FIXATION (ORIF) CLAVICULAR FRACTURE (Right)  Patient Location: PACU  Anesthesia Type:General  Level of Consciousness: awake and patient cooperative  Airway & Oxygen Therapy: Patient Spontanous Breathing and Patient connected to face mask oxygen  Post-op Assessment: Report given to RN and Post -op Vital signs reviewed and stable  Post vital signs: Reviewed and stable  Last Vitals:  Vitals Value Taken Time  BP 146/84 05/16/22 1426  Temp    Pulse 64 05/16/22 1428  Resp 22 05/16/22 1428  SpO2 100 % 05/16/22 1428  Vitals shown include unvalidated device data.  Last Pain:  Vitals:   05/16/22 1018  TempSrc: Oral         Complications: No notable events documented.

## 2022-05-16 NOTE — Brief Op Note (Signed)
05/16/2022  1:57 PM  PATIENT:  Michael Oneal  66 y.o. male  PRE-OPERATIVE DIAGNOSIS:  Right clavicle midshift fracture  POST-OPERATIVE DIAGNOSIS:  Right clavicle midshift fracture  PROCEDURE:  Procedure(s) with comments: OPEN REDUCTION INTERNAL FIXATION (ORIF) CLAVICULAR FRACTURE (Right) - 75 wants to follow in room 3 after that case  SURGEON:  Surgeon(s) and Role:    * Stann Mainland, Elly Modena, MD - Primary  PHYSICIAN ASSISTANT: Jonelle Sidle, PA-C   ANESTHESIA:   local and general  EBL:  20 cc  BLOOD ADMINISTERED:none  DRAINS: none   LOCAL MEDICATIONS USED:  MARCAINE     SPECIMEN:  No Specimen  DISPOSITION OF SPECIMEN:  N/A  COUNTS:  YES  TOURNIQUET:  * No tourniquets in log *  DICTATION: .Note written in EPIC  PLAN OF CARE: Discharge to home after PACU  PATIENT DISPOSITION:  PACU - hemodynamically stable.   Delay start of Pharmacological VTE agent (>24hrs) due to surgical blood loss or risk of bleeding: not applicable
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
## 2022-05-16 NOTE — Anesthesia Preprocedure Evaluation (Signed)
Anesthesia Evaluation  Patient identified by MRN, date of birth, ID band Patient awake    Reviewed: Allergy & Precautions, NPO status , Patient's Chart, lab work & pertinent test results  Airway Mallampati: II  TM Distance: >3 FB Neck ROM: Full    Dental no notable dental hx.    Pulmonary asthma    Pulmonary exam normal        Cardiovascular negative cardio ROS  Rhythm:Regular Rate:Normal     Neuro/Psych negative neurological ROS  negative psych ROS   GI/Hepatic Neg liver ROS, hiatal hernia,,,  Endo/Other  negative endocrine ROS    Renal/GU negative Renal ROS  negative genitourinary   Musculoskeletal Right clavicle fx   Abdominal Normal abdominal exam  (+)   Peds  Hematology negative hematology ROS (+)   Anesthesia Other Findings   Reproductive/Obstetrics                             Anesthesia Physical Anesthesia Plan  ASA: 2  Anesthesia Plan: General   Post-op Pain Management: Celebrex PO (pre-op)* and Tylenol PO (pre-op)*   Induction: Intravenous  PONV Risk Score and Plan: 2 and Ondansetron, Dexamethasone, Midazolam and Treatment may vary due to age or medical condition  Airway Management Planned: Mask and Oral ETT  Additional Equipment: None  Intra-op Plan:   Post-operative Plan: Extubation in OR  Informed Consent: I have reviewed the patients History and Physical, chart, labs and discussed the procedure including the risks, benefits and alternatives for the proposed anesthesia with the patient or authorized representative who has indicated his/her understanding and acceptance.     Dental advisory given  Plan Discussed with: CRNA  Anesthesia Plan Comments:        Anesthesia Quick Evaluation

## 2022-05-16 NOTE — Discharge Instructions (Addendum)
Orthopedic surgery discharge instructions:  -Maintain postoperative bandage until your follow-up appointment.  This bandage is waterproof and you may shower and that bandage beginning on postoperative day #2.  Please do not submerge the shoulder underwater.  -Maintain your sling at all times unless performing activities of daily living or school/desk work.  No lifting above shoulder height for the first 2 weeks.  You may move the elbow, hand and wrist as tolerated.  Again and you are free to use the right arm for activities of daily living such as eating, getting dressed, and bathing and doing schoolwork.  -Apply ice to the operative shoulder along the surgical bandage for 20 to 30 minutes out of each hour.  Do this as frequently as possible throughout the day.  -For mild to moderate pain you should use Tylenol and or Advil in alternating fashion.  Do this throughout the day and utilize hydrocodone as needed for breakthrough pain. you should try to wean off of the hydrocodone over the next 3-5 days.  -You will return to see Dr. Stann Mainland in the office in 2 weeks for postoperative x-rays and wound check.
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
## 2022-05-16 NOTE — Anesthesia Procedure Notes (Signed)
Procedure Name: Intubation Date/Time: 05/16/2022 1:02 PM  Performed by: Eben Burow, CRNAPre-anesthesia Checklist: Patient identified, Emergency Drugs available, Suction available, Patient being monitored and Timeout performed Patient Re-evaluated:Patient Re-evaluated prior to induction Oxygen Delivery Method: Circle system utilized Preoxygenation: Pre-oxygenation with 100% oxygen Induction Type: IV induction Ventilation: Mask ventilation without difficulty Laryngoscope Size: Mac and 4 Grade View: Grade II Tube type: Oral Tube size: 7.5 mm Number of attempts: 1 Airway Equipment and Method: Stylet Placement Confirmation: ETT inserted through vocal cords under direct vision, positive ETCO2 and breath sounds checked- equal and bilateral Secured at: 22 cm Tube secured with: Tape Dental Injury: Teeth and Oropharynx as per pre-operative assessment

## 2022-05-17 ENCOUNTER — Encounter (HOSPITAL_COMMUNITY): Payer: Self-pay | Admitting: Orthopedic Surgery

## 2022-05-17 NOTE — Anesthesia Postprocedure Evaluation (Signed)
Anesthesia Post Note  Patient: Pharmacist, hospital  Procedure(s) Performed: OPEN REDUCTION INTERNAL FIXATION (ORIF) CLAVICULAR FRACTURE (Right)     Patient location during evaluation: PACU Anesthesia Type: General Level of consciousness: awake and alert Pain management: pain level controlled Vital Signs Assessment: post-procedure vital signs reviewed and stable Respiratory status: spontaneous breathing, nonlabored ventilation, respiratory function stable and patient connected to nasal cannula oxygen Cardiovascular status: blood pressure returned to baseline and stable Postop Assessment: no apparent nausea or vomiting Anesthetic complications: no   No notable events documented.  Last Vitals:  Vitals:   05/16/22 1600 05/16/22 1614  BP: 130/76 127/77  Pulse: (!) 59 (!) 59  Resp: 15 15  Temp:  36.7 C  SpO2:  99%    Last Pain:  Vitals:   05/16/22 1614  TempSrc:   PainSc: 0-No pain                 Santa Lighter

## 2022-05-30 DIAGNOSIS — Z4789 Encounter for other orthopedic aftercare: Secondary | ICD-10-CM | POA: Diagnosis not present

## 2022-05-30 DIAGNOSIS — S42001D Fracture of unspecified part of right clavicle, subsequent encounter for fracture with routine healing: Secondary | ICD-10-CM | POA: Diagnosis not present

## 2022-06-13 DIAGNOSIS — S42001D Fracture of unspecified part of right clavicle, subsequent encounter for fracture with routine healing: Secondary | ICD-10-CM | POA: Diagnosis not present

## 2022-06-18 DIAGNOSIS — S42001D Fracture of unspecified part of right clavicle, subsequent encounter for fracture with routine healing: Secondary | ICD-10-CM | POA: Diagnosis not present

## 2022-06-21 DIAGNOSIS — S42001D Fracture of unspecified part of right clavicle, subsequent encounter for fracture with routine healing: Secondary | ICD-10-CM | POA: Diagnosis not present

## 2022-06-25 DIAGNOSIS — S42001D Fracture of unspecified part of right clavicle, subsequent encounter for fracture with routine healing: Secondary | ICD-10-CM | POA: Diagnosis not present

## 2022-06-27 DIAGNOSIS — S42001D Fracture of unspecified part of right clavicle, subsequent encounter for fracture with routine healing: Secondary | ICD-10-CM | POA: Diagnosis not present

## 2022-06-27 DIAGNOSIS — Z4789 Encounter for other orthopedic aftercare: Secondary | ICD-10-CM | POA: Diagnosis not present

## 2022-07-02 DIAGNOSIS — S42001D Fracture of unspecified part of right clavicle, subsequent encounter for fracture with routine healing: Secondary | ICD-10-CM | POA: Diagnosis not present

## 2022-07-04 DIAGNOSIS — S42001D Fracture of unspecified part of right clavicle, subsequent encounter for fracture with routine healing: Secondary | ICD-10-CM | POA: Diagnosis not present

## 2022-07-17 DIAGNOSIS — S42001D Fracture of unspecified part of right clavicle, subsequent encounter for fracture with routine healing: Secondary | ICD-10-CM | POA: Diagnosis not present

## 2022-07-19 DIAGNOSIS — S42001D Fracture of unspecified part of right clavicle, subsequent encounter for fracture with routine healing: Secondary | ICD-10-CM | POA: Diagnosis not present

## 2022-07-23 DIAGNOSIS — S42001D Fracture of unspecified part of right clavicle, subsequent encounter for fracture with routine healing: Secondary | ICD-10-CM | POA: Diagnosis not present

## 2022-07-25 DIAGNOSIS — Z4789 Encounter for other orthopedic aftercare: Secondary | ICD-10-CM | POA: Diagnosis not present

## 2022-10-24 ENCOUNTER — Ambulatory Visit: Payer: BC Managed Care – PPO | Attending: Cardiovascular Disease | Admitting: Cardiovascular Disease

## 2022-10-24 ENCOUNTER — Encounter: Payer: Self-pay | Admitting: Cardiovascular Disease

## 2022-10-24 VITALS — BP 142/82 | HR 45 | Ht 72.0 in | Wt 156.4 lb

## 2022-10-24 DIAGNOSIS — R002 Palpitations: Secondary | ICD-10-CM | POA: Diagnosis not present

## 2022-10-24 DIAGNOSIS — E7800 Pure hypercholesterolemia, unspecified: Secondary | ICD-10-CM
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
## 2022-10-24 NOTE — Patient Instructions (Signed)
Medication Instructions:  No changes *If you need a refill on your cardiac medications before your next appointment, please call your pharmacy*   Lab Work: Today: cmet, lipids, sh If you have labs (blood work) drawn today and your tests are completely normal, you will receive your results only by: MyChart Message (if you have MyChart) OR A paper copy in the mail If you have any lab test that is abnormal or we need to change your treatment, we will call you to review the results.   Testing/Procedures: Your physician has requested that you have an echocardiogram. Echocardiography is a painless test that uses sound waves to create images of your heart. It provides your doctor with information about the size and shape of your heart and how well your heart's chambers and valves are working. This procedure takes approximately one hour. There are no restrictions for this procedure. Please do NOT wear cologne, perfume, aftershave, or lotions (deodorant is allowed). Please arrive 15 minutes prior to your appointment time.    Follow-Up: At [facility], you and your health needs are our priority.  As part of our continuing mission to provide you with exceptional heart care, we have created designated Provider Care Teams.  These Care Teams include your primary Cardiologist (physician) and Advanced Practice Providers (APPs -  Physician Assistants and Nurse Practitioners) who all work together to provide you with the care you need, when you need it.   Your next appointment:   2-3 month(s)  Provider:   Verne Carrow, MD

## 2022-10-25 LAB — COMPREHENSIVE METABOLIC PANEL
ALT: 23 IU/L (ref 0–44)
AST: 28 IU/L (ref 0–40)
Albumin/Globulin Ratio: 2.1 (ref 1.2–2.2)
Albumin: 4.7 g/dL (ref 3.9–4.9)
Alkaline Phosphatase: 78 IU/L (ref 44–121)
BUN/Creatinine Ratio: 16 (ref 10–24)
BUN: 13 mg/dL (ref 8–27)
Bilirubin Total: 0.6 mg/dL (ref 0.0–1.2)
CO2: 24 mmol/L (ref 20–29)
Calcium: 10 mg/dL (ref 8.6–10.2)
Chloride: 105 mmol/L (ref 96–106)
Creatinine, Ser: 0.8 mg/dL (ref 0.76–1.27)
Globulin, Total: 2.2 g/dL (ref 1.5–4.5)
Glucose: 78 mg/dL (ref 70–99)
Potassium: 5.1 mmol/L (ref 3.5–5.2)
Sodium: 143 mmol/L (ref 134–144)
Total Protein: 6.9 g/dL (ref 6.0–8.5)
eGFR: 98 mL/min/{1.73_m2} (ref >59)

## 2022-10-25 LAB — LIPID PANEL
Chol/HDL Ratio: 1.9 ratio (ref 0.0–5.0)
Cholesterol, Total: 190 mg/dL (ref 100–199)
HDL: 98 mg/dL (ref >39)
LDL Chol Calc (NIH): 79 mg/dL (ref 0–99)
Triglycerides: 70 mg/dL (ref 0–149)
VLDL Cholesterol Cal: 13 mg/dL (ref 5–40)

## 2022-10-25 LAB — TSH: TSH: 1.94 u[IU]/mL (ref 0.450–4.500)

## 2022-10-28 ENCOUNTER — Telehealth: Payer: Self-pay

## 2022-10-28 ENCOUNTER — Ambulatory Visit: Payer: BC Managed Care – PPO | Attending: Cardiovascular Disease

## 2022-10-28 DIAGNOSIS — R002 Palpitations: Secondary | ICD-10-CM

## 2022-10-28 NOTE — Telephone Encounter (Signed)
Called patient to get Zio ordered. Patient with questions about wear time of Zio, extensive education provided about wear time, how to apply adhesive, customer care number and types of arrhythmias that can be found on this device. Patient agrees to plan, order placed.

## 2022-10-28 NOTE — Progress Notes (Unsigned)
Enrolled for [employer] to mail a ZIO XT long term holter monitor to the patients address on file.  

## 2022-11-03 DIAGNOSIS — R002 Palpitations: Secondary | ICD-10-CM

## 2022-11-04 DIAGNOSIS — L918 Other hypertrophic disorders of the skin: Secondary | ICD-10-CM | POA: Diagnosis not present

## 2022-11-04 DIAGNOSIS — L821 Other seborrheic keratosis: Secondary | ICD-10-CM | POA: Diagnosis not present

## 2022-11-04 DIAGNOSIS — D485 Neoplasm of uncertain behavior of skin: Secondary | ICD-10-CM | POA: Diagnosis not present

## 2022-11-18 ENCOUNTER — Ambulatory Visit (HOSPITAL_COMMUNITY): Payer: BC Managed Care – PPO | Attending: Cardiovascular Disease

## 2022-11-18 DIAGNOSIS — I340 Nonrheumatic mitral (valve) insufficiency: Secondary | ICD-10-CM

## 2022-11-18 DIAGNOSIS — R002 Palpitations: Secondary | ICD-10-CM

## 2022-11-18 LAB — ECHOCARDIOGRAM COMPLETE
Area-P 1/2: 4.6 cm2
MV M vel: 2.88 m/s
MV Peak grad: 33.2 mmHg
P 1/2 time: 616 msec
Radius: 0.55 cm
S' Lateral: 2.9 cm

## 2022-11-20 ENCOUNTER — Other Ambulatory Visit: Payer: Self-pay | Admitting: *Deleted

## 2022-11-20 DIAGNOSIS — I071 Rheumatic tricuspid insufficiency: Secondary | ICD-10-CM

## 2022-11-20 DIAGNOSIS — I340 Nonrheumatic mitral (valve) insufficiency: Secondary | ICD-10-CM

## 2022-11-20 DIAGNOSIS — I351 Nonrheumatic aortic (valve) insufficiency: Secondary | ICD-10-CM

## 2022-11-20 NOTE — Progress Notes (Signed)
Per echo results, repeat in one year.  Order placed.

## 2022-12-06 DIAGNOSIS — R002 Palpitations: Secondary | ICD-10-CM | POA: Diagnosis not present

## 2022-12-10 ENCOUNTER — Telehealth: Payer: Self-pay

## 2022-12-10 MED ORDER — ASPIRIN 81 MG PO TBEC: 81.0000 mg | DELAYED_RELEASE_TABLET | Freq: Every day | ORAL | 3 refills | Status: DC

## 2022-12-10 NOTE — Telephone Encounter (Signed)
The patient has been notified of the result and verbalized understanding.  All questions (if any) were answered. Frutoso Schatz, RN 12/10/2022 11:58 AM

## 2022-12-10 NOTE — Telephone Encounter (Signed)
-----   Message from Kathleene Hazel, MD sent at 12/10/2022 10:57 AM EDT ----- Can we let him know that the monitor confirms atrial fib/flutter with the longest episode lasting 2.5 minutes. He is in sinus most of the time. CHADS VASC score of 1 due to age. I would suggest he take ASA 81 mg per day. He has a follow up with Tereso Newcomer in several weeks. Thanks, chris

## 2022-12-27 DIAGNOSIS — G43109 Migraine with aura, not intractable, without status migrainosus: Secondary | ICD-10-CM | POA: Diagnosis not present

## 2022-12-28 ENCOUNTER — Telehealth: Payer: Self-pay | Admitting: Internal Medicine

## 2022-12-28 NOTE — Telephone Encounter (Signed)
Patient contacted the cardiology after hours line to discuss his intermittent feeling of palpitations concerning for recurrence of his atrial fibrillation.  He states that the palpitations did resolved.  He recently had a pinpoint visual loss to one his eyes a few days prior.  Patient states he was seen by an optometrist for which nothing was seen.  He was not told to go to the emergency department for further evaluation by his optometrist.  He is now without any symptoms.  I informed patient that it's reasonable to be seen at his local emergency department now and especially if his symptoms recur.  He expressed understanding.  He does not want to be seen at the emergency department at this time.  He is currently taking aspirin.  He has follow up in two days with his cardiologist.

## 2022-12-29 ENCOUNTER — Encounter: Payer: Self-pay | Admitting: Physician Assistant

## 2022-12-29 DIAGNOSIS — I480 Paroxysmal atrial fibrillation: Secondary | ICD-10-CM

## 2022-12-29 DIAGNOSIS — I2510 Atherosclerotic heart disease of native coronary artery without angina pectoris: Secondary | ICD-10-CM

## 2022-12-29 DIAGNOSIS — I340 Nonrheumatic mitral (valve) insufficiency: Secondary | ICD-10-CM

## 2022-12-29 HISTORY — DX: Atherosclerotic heart disease of native coronary artery without angina pectoris: I25.10

## 2022-12-29 HISTORY — DX: Nonrheumatic mitral (valve) insufficiency: I34.0

## 2022-12-29 HISTORY — DX: Paroxysmal atrial fibrillation: I48.0

## 2022-12-29 NOTE — Progress Notes (Unsigned)
Cardiology Office Note:    Date:  12/30/2022  ID:  Michael Oneal, DOB 10/13/1955, MRN [id number] PCP: Patient, No Pcp Per  [facility] Providers Cardiologist:  Verne Carrow, MD       Patient Profile:      Coronary artery Ca2+ on CT  Paroxysmal atrial fibrillation  Monitor 11/2022: NSR, avg 58, 3 SV runs (longest 11 beats), <1% burden AFib/Flutter, rare PVCs, PACs Mitral regurgitation, Tricuspid regurgitation, aortic insufficiency  TTE 11/18/22: EF 67, no REMA, NL RVSF, NL PASP, mod MR, mild to mod TR, mild AI, RAP 3 Hyperlipidemia  Palpitations Asthma        History of Present Illness:   Michael Oneal is a 67 y.o. male who returns for follow up of AFib, valvular heart disease, CAD. He was evaluated by Dr. Clifton James in 10/2022 for palpitations. A reading from his [facility] was suspicious for atrial fibrillation. A Zio monitor confirmed < 1% burden of AFib.   He is here alone.  He notes that episodes of atrial fibrillation have become frequent over the past year.  His first episode was in January and it lasted overnight.  He exercises on a regular basis.  He is run several marathons since the age of 6.  His baseline heart rate is usually in the 40s.  When he has atrial fibrillation, he is unable to exercise vigorously.  He had another episode of atrial fibrillation 2 days ago.  It lasted several hours.  He also had an episode of left eye visual changes 3 days ago.  He was sitting in a meeting and part of his left visual field was missing.  He does not seem to describe an entire quadrant or half of his visual field.  It went on about 15 or 20 minutes.  He saw his optometrist urgently.  There were no abnormal findings on exam.  He has never had migraine headaches.  ROS see HPI    Studies Reviewed:    EKG:  not done  Risk Assessment/Calculations:    CHA2DS2-VASc Score = 2   This indicates a 2.2% annual risk of stroke. The patient's score is based upon: CHF History: 0 HTN  History: 0 Diabetes History: 0 Stroke History: 0 Vascular Disease History: 1 Age Score: 1 Gender Score: 0            Physical Exam:   VS:  BP 110/70   Pulse (!) 48   Ht 5\' 11"  (1.803 m)   Wt 154 lb 6.4 oz (70 kg)   SpO2 98%   BMI 21.53 kg/m    Wt Readings from Last 3 Encounters:  12/30/22 154 lb 6.4 oz (70 kg)  10/24/22 156 lb 6.4 oz (70.9 kg)  05/16/22 144 lb (65.3 kg)    Physical Exam     ASSESSMENT AND PLAN:   PAF (paroxysmal atrial fibrillation) (HCC) Low burden of AFib on his monitor. He seems to have an episode about 1 time a month. CHA2DS2-VASc Score = 2 [CHF History: 0, HTN History: 0, Diabetes History: 0, Stroke History: 0, Vascular Disease History: 1, Age Score: 1, Gender Score: 0]. Therefore, the patient's annual risk of stroke is 2.2 %. He had an episode of visual changes in his L eye last week. Question if this was an atypical migraine vs a TIA. Given his hx of AFib, I think we have to assume he had a TIA for now. I think he should be on anticoagulation. I discussed this with him and  he agrees. He has a low baseline HR b/c of his years of vigorous exercise. I am not certain he could tolerate a low dose beta-blocker even if given as needed. I think it is worthwhile to refer him to EP to see if PVI ablation can be considered.  Head CT w/o contrast today If Head CT neg for bleed, start Eliquis 5 mg twice daily  CBC, BMET 6 weeks DC ASA Refer to EP for AFib  Mitral regurgitation We discussed the findings on his echocardiogram in detail today.  Moderate mitral regurgitation, mild to moderate tricuspid regurgitation and mild aortic insufficiency on recent echocardiogram.  He will have another echocardiogram in 1 year.  CAD (coronary artery disease) Coronary calcification noted on CT scan in [place] last year.  He exercises vigorously without chest discomfort to suggest angina.  TIA (transient ischemic attack) As noted, he had visual field changes  last week.  He did see his optometrist.  His exam was normal.  Question if he had an atypical migraine versus TIA.  Obtain head CT without contrast today to rule out bleed.  As long as this is negative, he can start on Eliquis.  Arrange carotid Dopplers.  Refer to neurology.      Dispo:  Return in about 6 months (around 07/01/2023) for Routine Follow Up with Dr. Clifton James.  Signed, Tereso Newcomer, PA-C

## 2022-12-30 ENCOUNTER — Other Ambulatory Visit: Payer: Self-pay | Admitting: *Deleted

## 2022-12-30 ENCOUNTER — Ambulatory Visit
Admission: RE | Admit: 2022-12-30 | Discharge: 2022-12-30 | Disposition: A | Discharge status: Home / Self Care | Payer: BC Managed Care – PPO | Source: Ambulatory Visit | Attending: Physician Assistant | Admitting: Physician Assistant

## 2022-12-30 ENCOUNTER — Encounter: Payer: Self-pay | Admitting: Physician Assistant

## 2022-12-30 ENCOUNTER — Ambulatory Visit: Payer: BC Managed Care – PPO | Admitting: Physician Assistant

## 2022-12-30 VITALS — BP 110/70 | HR 48 | Ht 71.0 in | Wt 154.4 lb

## 2022-12-30 DIAGNOSIS — I2510 Atherosclerotic heart disease of native coronary artery without angina pectoris: Secondary | ICD-10-CM

## 2022-12-30 DIAGNOSIS — G459 Transient cerebral ischemic attack, unspecified: Secondary | ICD-10-CM

## 2022-12-30 DIAGNOSIS — I340 Nonrheumatic mitral (valve) insufficiency: Secondary | ICD-10-CM

## 2022-12-30 DIAGNOSIS — I480 Paroxysmal atrial fibrillation: Secondary | ICD-10-CM | POA: Diagnosis not present

## 2022-12-30 MED ORDER — APIXABAN 5 MG PO TABS: 5.0000 mg | ORAL_TABLET | Freq: Two times a day (BID) | ORAL | 0 refills | Status: DC

## 2022-12-30 MED ORDER — APIXABAN 5 MG PO TABS: 5.0000 mg | ORAL_TABLET | Freq: Two times a day (BID) | ORAL | 3 refills | Status: DC

## 2022-12-30 NOTE — Assessment & Plan Note (Signed)
We discussed the findings on his echocardiogram in detail today.  Moderate mitral regurgitation, mild to moderate tricuspid regurgitation and mild aortic insufficiency on recent echocardiogram.  He will have another echocardiogram in 1 year.

## 2022-12-30 NOTE — Patient Instructions (Signed)
Medication Instructions:  Your physician has recommended you make the following change in your medication:   STOP Aspirin  START Eliquis 5 mg taking 1 twice a day (DON'T START UNTIL WE CALL YOU BACK)  *If you need a refill on your cardiac medications before your next appointment, please call your pharmacy*   Lab Work: 6 WEEKS:  BMET & CBC  If you have labs (blood work) drawn today and your tests are completely normal, you will receive your results only by: MyChart Message (if you have MyChart) OR A paper copy in the mail If you have any lab test that is abnormal or we need to change your treatment, we will call you to review the results.   Testing/Procedures: Your physician has requested that you have a carotid duplex. This test is an ultrasound of the carotid arteries in your neck. It looks at blood flow through these arteries that supply the brain with blood. Allow one hour for this exam. There are no restrictions or special instructions.   Non-Cardiac CT scanning, (CAT scanning), is a noninvasive, special x-ray that produces cross-sectional images of the body using x-rays and a computer. CT scans help physicians diagnose and treat medical conditions. For some CT exams, a contrast material is used to enhance visibility in the area of the body being studied. CT scans provide greater clarity and reveal more details than regular x-ray exams.  You have been referred to see one of our Electrophysiologist to discuss Afib Ablation  You have been referred to Neurology.  They will call you to schedule    Follow-Up: At [facility], you and your health needs are our priority.  As part of our continuing mission to provide you with exceptional heart care, we have created designated Provider Care Teams.  These Care Teams include your primary Cardiologist (physician) and Advanced Practice Providers (APPs -  Physician Assistants and Nurse Practitioners) who all work together to provide you  with the care you need, when you need it.  We recommend signing up for the patient portal called "MyChart".  Sign up information is provided on this After Visit Summary.  MyChart is used to connect with patients for Virtual Visits (Telemedicine).  Patients are able to view lab/test results, encounter notes, upcoming appointments, etc.  Non-urgent messages can be sent to your provider as well.   To learn more about what you can do with MyChart, go to [email or web address].    Your next appointment:   6 month(s)  Provider:   Verne Carrow, MD     Other Instructions

## 2022-12-30 NOTE — Assessment & Plan Note (Signed)
Coronary calcification noted on CT scan in [place] last year.  He exercises vigorously without chest discomfort to suggest angina.

## 2022-12-30 NOTE — Assessment & Plan Note (Signed)
Low burden of AFib on his monitor. He seems to have an episode about 1 time a month. CHA2DS2-VASc Score = 2 [CHF History: 0, HTN History: 0, Diabetes History: 0, Stroke History: 0, Vascular Disease History: 1, Age Score: 1, Gender Score: 0]. Therefore, the patient's annual risk of stroke is 2.2 %. He had an episode of visual changes in his L eye last week. Question if this was an atypical migraine vs a TIA. Given his hx of AFib, I think we have to assume he had a TIA for now. I think he should be on anticoagulation. I discussed this with him and he agrees. He has a low baseline HR b/c of his years of vigorous exercise. I am not certain he could tolerate a low dose beta-blocker even if given as needed. I think it is worthwhile to refer him to EP to see if PVI ablation can be considered.  Head CT w/o contrast today If Head CT neg for bleed, start Eliquis 5 mg twice daily  CBC, BMET 6 weeks DC ASA Refer to EP for AFib

## 2022-12-30 NOTE — Assessment & Plan Note (Signed)
As noted, he had visual field changes last week.  He did see his optometrist.  His exam was normal.  Question if he had an atypical migraine versus TIA.  Obtain head CT without contrast today to rule out bleed.  As long as this is negative, he can start on Eliquis.  Arrange carotid Dopplers.  Refer to neurology.

## 2023-01-06 ENCOUNTER — Encounter: Payer: Self-pay | Admitting: Neurology

## 2023-01-07 ENCOUNTER — Telehealth: Payer: Self-pay | Admitting: Physician Assistant

## 2023-01-07 NOTE — Telephone Encounter (Signed)
Pts wife dropped of ct scans to be looked at by provider  Will be put into providers box by eod

## 2023-01-08 NOTE — Telephone Encounter (Signed)
Ct scan discs have been placed on Tereso Newcomer, PA-C's desk for review.

## 2023-01-10 ENCOUNTER — Ambulatory Visit (HOSPITAL_COMMUNITY)
Admission: RE | Admit: 2023-01-10 | Discharge: 2023-01-10 | Disposition: A | Discharge status: Home / Self Care | Payer: BC Managed Care – PPO | Source: Ambulatory Visit | Attending: Physician Assistant | Admitting: Physician Assistant

## 2023-01-10 DIAGNOSIS — G459 Transient cerebral ischemic attack, unspecified: Secondary | ICD-10-CM | POA: Insufficient documentation

## 2023-01-10 DIAGNOSIS — I480 Paroxysmal atrial fibrillation: Secondary | ICD-10-CM | POA: Insufficient documentation

## 2023-01-10 DIAGNOSIS — I2510 Atherosclerotic heart disease of native coronary artery without angina pectoris: Secondary | ICD-10-CM | POA: Diagnosis not present

## 2023-01-10 DIAGNOSIS — I340 Nonrheumatic mitral (valve) insufficiency: Secondary | ICD-10-CM | POA: Insufficient documentation

## 2023-01-13 ENCOUNTER — Ambulatory Visit: Payer: BC Managed Care – PPO | Attending: Cardiovascular Disease | Admitting: Cardiovascular Disease

## 2023-01-13 ENCOUNTER — Encounter: Payer: Self-pay | Admitting: Cardiovascular Disease

## 2023-01-13 VITALS — BP 154/82 | HR 37 | Ht 71.0 in | Wt 155.0 lb

## 2023-01-13 DIAGNOSIS — G459 Transient cerebral ischemic attack, unspecified: Secondary | ICD-10-CM

## 2023-01-13 DIAGNOSIS — I480 Paroxysmal atrial fibrillation: Secondary | ICD-10-CM | POA: Diagnosis not present

## 2023-01-13 NOTE — Progress Notes (Unsigned)
NEUROLOGY CONSULTATION NOTE  Michael Oneal MRN: [id number] DOB: 07/30/1955  Referring provider: Tereso Newcomer, PA-C Primary care provider: No PCP  Reason for consult:  TIA  Assessment/Plan:   Transient ischemic attack,likely cardioembolic due to paroxysmal atrial fibrillation - probable central retinal artery occlusion but cannot rule out occipital infarct as he is not absolutely certain it was monocular. Paroxysmal atrial fibrillation Hyperlipidemia   Will check MRI of brain Continue secondary stroke prevention as per cardiology (advised to establish care with PCP - provided contacts): Eliquis Statin.  LDL goal less than 70 Normotensive blood pressure Hgb A1c goal less than 7 Follow up 6 months.   Subjective:  Michael Oneal is a 67 year old right-handed male with PAF, CAD, mitral regurgitation, and HLD who presents for transient ischemic attack.  History supplemented by referring provider's note.    In January, he began experiencing recurrent episodes of palpitations.  2D echo on 5/6 revealed EF 67% with moderate mitral valve regurgitation and no atrial level shunt.  In mid-June, he was at work and started having vision problems in his left eye.  He noted a jagged shaped afterimage that turned beige in the central vision.  He closed either eye and thinks it was seen only in his left eye.  Lasted 15 minutes.  No associated headache, slurred speech, facial droop or unilateral numbness or weakness.  Immediately saw his eye doctor with unremarkable exam.  CT head on 6/17 personally reviewed showed chronic small vessel ischemic changes but no acute intracranial abnormality.  He was subsequently started on Eliquis.  Carotid doppler on 6/28 revealed no hemodynamically significant stenosis.  LDL from April was 79.  Glucose was 78.    No history of migrianes     PAST MEDICAL HISTORY: Past Medical History:  Diagnosis Date   Allergy    Asthma    Exercise induced asthma   CAD (coronary  artery disease) 12/29/2022   Coronary Ca2+ on CT   Fatigue    History of hiatal hernia    noted on CT scan   History of shingles 10/12/2014   Hyperlipidemia    Mitral regurgitation 12/29/2022   TTE 11/18/22: EF 67, no REMA, NL RVSF, NL PASP, mod MR, mild to mod TR, mild AI, RAP 3   Other abnormalities of heart beat    PAF (paroxysmal atrial fibrillation) (HCC) 12/29/2022   Monitor 11/2022: NSR, avg 58, 3 SV runs (longest 11 beats), <1% burden AFib/Flutter, rare PVCs, PACs   Palpitations    occ. random, resolves in a few minutes   Pneumothorax 05/04/2022   Mild, after injury   Right hamstring muscle strain 10/10/2014   Skin cancer    Trigeminal neuralgia     PAST SURGICAL HISTORY: Past Surgical History:  Procedure Laterality Date   MOUTH SURGERY     Dental   ORIF CLAVICULAR FRACTURE Right 05/16/2022   Procedure: OPEN REDUCTION INTERNAL FIXATION (ORIF) CLAVICULAR FRACTURE;  Surgeon: Yolonda Kida, MD;  Location: [facility];  Service: Orthopedics;  Laterality: Right;  75 wants to follow in room 3 after that case    MEDICATIONS: Current Outpatient Medications on File Prior to Visit  Medication Sig Dispense Refill   apixaban (ELIQUIS) 5 MG TABS tablet Take 1 tablet (5 mg total) by mouth 2 (two) times daily. 180 tablet 3   apixaban (ELIQUIS) 5 MG TABS tablet Take 1 tablet (5 mg total) by mouth 2 (two) times daily. 56 tablet 0   Flaxseed, Linseed, (FLAX SEEDS PO)  Take 1,000 mg by mouth 2 (two) times daily.     Omega-3 Fatty Acids (FISH OIL) 1200 MG CAPS Take 1,200 mg by mouth 2 (two) times daily.     Red Yeast Rice Extract (RED YEAST RICE PO) Take 2,400 mg by mouth 2 (two) times daily. 1200 mg each     No current facility-administered medications on file prior to visit.    ALLERGIES: Allergies  Allergen Reactions   Macadamia Nut Oil Other (See Comments)    FAMILY HISTORY: Family History  Problem Relation Age of Onset   Heart disease Mother        Valve disease/Rheumatic  heart disease   Hyperlipidemia Mother    Coronary artery disease Mother    Heart disease Father    Heart attack Father        Heavy smoker    Objective:  Blood pressure (!) 140/75, pulse (!) 49, height 5\' 11"  (1.803 m), weight 152 lb (68.9 kg), SpO2 98 %. General: No acute distress.  Patient appears well-groomed.   Head:  Normocephalic/atraumatic Eyes:  fundi examined but not visualized Neck: supple, no paraspinal tenderness, full range of motion Back: No paraspinal tenderness Heart: regular rate and rhythm Neurological Exam: Mental status: alert and oriented to person, place, and time, speech fluent and not dysarthric, language intact. Cranial nerves: CN I: not tested CN II: pupils equal, round and reactive to light, visual fields intact CN III, IV, VI:  full range of motion, no nystagmus, no ptosis CN V: facial sensation intact. CN VII: upper and lower face symmetric CN VIII: hearing intact CN IX, X: gag intact, uvula midline CN XI: sternocleidomastoid and trapezius muscles intact CN XII: tongue midline Bulk & Tone: normal, no fasciculations. Motor:  muscle strength 5/5 throughout Sensation:  Pinprick sensation intact vibratory sensation reduced in toes. Deep Tendon Reflexes:  2+ throughout,  toes downgoing.   Finger to nose testing:  Without dysmetria.   Heel to shin:  Without dysmetria.   Gait:  Normal station and stride.  Romberg negative.    Thank you for allowing me to take part in the care of this patient.  Shon Millet, DO  CC: Tereso Newcomer, PA-C

## 2023-01-13 NOTE — Patient Instructions (Signed)
Medication Instructions:  Your physician recommends that you continue on your current medications as directed. Please refer to the Current Medication list given to you today. *If you need a refill on your cardiac medications before your next appointment, please call your pharmacy*    Test/procedure: Atrial Fibrillation Ablation Your physician has recommended that you have an ablation. Catheter ablation is a medical procedure used to treat some cardiac arrhythmias (irregular heartbeats). During catheter ablation, a long, thin, flexible tube is put into a blood vessel in your groin (upper thigh), or neck. This tube is called an ablation catheter. It is then guided to your heart through the blood vessel. Radio frequency waves destroy small areas of heart tissue where abnormal heartbeats may cause an arrhythmia to start. Please see the instruction sheet given to you today.   Follow-Up: At [facility], you and your health needs are our priority.  As part of our continuing mission to provide you with exceptional heart care, we have created designated Provider Care Teams.  These Care Teams include your primary Cardiologist (physician) and Advanced Practice Providers (APPs -  Physician Assistants and Nurse Practitioners) who all work together to provide you with the care you need, when you need it.  We recommend signing up for the patient portal called "MyChart".  Sign up information is provided on this After Visit Summary.  MyChart is used to connect with patients for Virtual Visits (Telemedicine).  Patients are able to view lab/test results, encounter notes, upcoming appointments, etc.  Non-urgent messages can be sent to your provider as well.   To learn more about what you can do with MyChart, go to [email or web address].    Your next appointment:   6 month(s)  Provider:   York Pellant, MD

## 2023-01-13 NOTE — Progress Notes (Signed)
  Electrophysiology Office Note:    Date:  01/13/2023   ID:  Michael Oneal, DOB August 12, 1955, MRN [id number]  PCP:  Patient, No Pcp Per   [facility] Providers Cardiologist:  Verne Carrow, MD     Referring MD: Beatrice Lecher, PA-C   History of Present Illness:    Michael Oneal is a 67 y.o. male with a medical history significant for atrial fibrillation, moderate MR, presence of coronary calcium on CT, possible TIA referred for AF management.     He has experienced brief palpitations for years. It wasn't until July 18 2022 that episodes began to last longer than a few seconds or minutes. A Zio monitor was placed that confirmed the diagnosis of AF. He has had a few episodes intermittently over the past 6 months.   He is athletic, exercises every morning -- either jogging or cycling. Since starting anticoagulation, he has not been riding outdoors. His exertional capacity is significantly reduced when in AF.  A few weeks ago he experienced a visual disturbance thought to be due to TIA possibly.      He reports he is doing well today.  EKGs/Labs/Other Studies Reviewed Today:    Echocardiogram:  TTE 11/18/2022 EF 67%. Moderate MR   Monitors:  14 day Zio -- my interpretation Sinus rhythm, HR 36-147 <1% AF burden, rates 90-148, avg 134 Patient triggered events were associated with sinus rhythm and episodes of AF  Stress testing:   Advanced imaging:   Cardiac catherization   EKG:   EKG Interpretation Date/Time:  Monday January 13 2023 11:04:18 EDT Ventricular Rate:  37 PR Interval:  136 QRS Duration:  90 QT Interval:  478 QTC Calculation: 375 R Axis:   89  Text Interpretation: Marked sinus bradycardia Minimal voltage criteria for LVH, may be normal variant ( Sokolow-Lyon ) When compared with ECG of 22-Apr-2019 14:24, No significant change was found Confirmed by York Pellant [phone]) on 01/13/2023 11:36:01 AM     Physical Exam:    VS:  BP (!) 154/82    Pulse (!) 37   Ht 5\' 11"  (1.803 m)   Wt 155 lb (70.3 kg)   SpO2 99%   BMI 21.62 kg/m     Wt Readings from Last 3 Encounters:  01/13/23 155 lb (70.3 kg)  12/30/22 154 lb 6.4 oz (70 kg)  10/24/22 156 lb 6.4 oz (70.9 kg)     GEN:  Well nourished, well developed in no acute distress CARDIAC: brady RRR, no murmurs, rubs, gallops RESPIRATORY:  Normal work of breathing MUSCULOSKELETAL: no edema    ASSESSMENT & PLAN:    Atrial fibrillation Paroxysmal, symptomatic Rates are uncontrolled in AF; he has baseline bradycardia I think rhythm control is warranted. We discussed options  We discussed the indication, rationale, logistics, anticipated benefits, and potential risks of the ablation procedure including but not limited to -- bleed at the groin access site, chest pain, damage to nearby organs such as the diaphragm, lungs, or esophagus, need for a drainage tube, or prolonged hospitalization. I explained that the risk for stroke, heart attack, need for open chest surgery, or even death is very low but not zero. he  expressed understanding and wishes to proceed.  Sinus bradycardia Suspect increased vagal tone due to athleticism.    Signed, Maurice Small, MD  01/13/2023 11:40 AM    [facility]

## 2023-01-14 ENCOUNTER — Telehealth: Payer: Self-pay | Admitting: *Deleted

## 2023-01-14 ENCOUNTER — Ambulatory Visit: Payer: BC Managed Care – PPO | Admitting: Neurology

## 2023-01-14 ENCOUNTER — Encounter: Payer: Self-pay | Admitting: Neurology

## 2023-01-14 VITALS — BP 140/75 | HR 49 | Ht 71.0 in | Wt 152.0 lb

## 2023-01-14 DIAGNOSIS — I2510 Atherosclerotic heart disease of native coronary artery without angina pectoris: Secondary | ICD-10-CM | POA: Diagnosis not present

## 2023-01-14 DIAGNOSIS — E7800 Pure hypercholesterolemia, unspecified: Secondary | ICD-10-CM

## 2023-01-14 DIAGNOSIS — G459 Transient cerebral ischemic attack, unspecified: Secondary | ICD-10-CM | POA: Diagnosis not present

## 2023-01-14 DIAGNOSIS — R413 Other amnesia: Secondary | ICD-10-CM | POA: Diagnosis not present

## 2023-01-14 DIAGNOSIS — I480 Paroxysmal atrial fibrillation: Secondary | ICD-10-CM

## 2023-01-14 MED ORDER — ROSUVASTATIN CALCIUM 10 MG PO TABS: 10.0000 mg | ORAL_TABLET | Freq: Every day | ORAL | 0 refills | Status: DC

## 2023-01-14 NOTE — Patient Instructions (Addendum)
MRI of brain without contrast Continue Eliquis. Start rosuvastatin as prescribed. Blood pressure control Routine exercise Mediterranean diet (see below) Follow up 6 months   Help finding a primary care provider within [place]. If you do not have access to a computer or the Internet you can call [phone] and  they will help you scheduled with a provider.  Or you can go to: InsuranceStats.ca      Mediterranean Diet A Mediterranean diet refers to food and lifestyle choices that are based on the traditions of countries located on the [employer]. It focuses on eating more fruits, vegetables, whole grains, beans, nuts, seeds, and heart-healthy fats, and eating less dairy, meat, eggs, and processed foods with added sugar, salt, and fat. This way of eating has been shown to help prevent certain conditions and improve outcomes for people who have chronic diseases, like kidney disease and heart disease. What are tips for following this plan? Reading food labels Check the serving size of packaged foods. For foods such as rice and pasta, the serving size refers to the amount of cooked product, not dry. Check the total fat in packaged foods. Avoid foods that have saturated fat or trans fats. Check the ingredient list for added sugars, such as corn syrup. Shopping  Buy a variety of foods that offer a balanced diet, including: Fresh fruits and vegetables (produce). Grains, beans, nuts, and seeds. Some of these may be available in unpackaged forms or large amounts (in bulk). Fresh seafood. Poultry and eggs. Low-fat dairy products. Buy whole ingredients instead of prepackaged foods. Buy fresh fruits and vegetables in-season from local farmers markets. Buy plain frozen fruits and vegetables. If you do not have access to quality fresh seafood, buy precooked frozen shrimp or canned fish, such as tuna, salmon, or sardines. Stock your pantry so you always have certain  foods on hand, such as olive oil, canned tuna, canned tomatoes, rice, pasta, and beans. Cooking Cook foods with extra-virgin olive oil instead of using butter or other vegetable oils. Have meat as a side dish, and have vegetables or grains as your main dish. This means having meat in small portions or adding small amounts of meat to foods like pasta or stew. Use beans or vegetables instead of meat in common dishes like chili or lasagna. Experiment with different cooking methods. Try roasting, broiling, steaming, and sauting vegetables. Add frozen vegetables to soups, stews, pasta, or rice. Add nuts or seeds for added healthy fats and plant protein at each meal. You can add these to yogurt, salads, or vegetable dishes. Marinate fish or vegetables using olive oil, lemon juice, garlic, and fresh herbs. Meal planning Plan to eat one vegetarian meal one day each week. Try to work up to two vegetarian meals, if possible. Eat seafood two or more times a week. Have healthy snacks readily available, such as: Vegetable sticks with hummus. Greek yogurt. Fruit and nut trail mix. Eat balanced meals throughout the week. This includes: Fruit: 2-3 servings a day. Vegetables: 4-5 servings a day. Low-fat dairy: 2 servings a day. Fish, poultry, or lean meat: 1 serving a day. Beans and legumes: 2 or more servings a week. Nuts and seeds: 1-2 servings a day. Whole grains: 6-8 servings a day. Extra-virgin olive oil: 3-4 servings a day. Limit red meat and sweets to only a few servings a month. Lifestyle  Cook and eat meals together with your family, when possible. Drink enough fluid to keep your urine pale yellow. Be physically active every  day. This includes: Aerobic exercise like running or swimming. Leisure activities like gardening, walking, or housework. Get 7-8 hours of sleep each night. If recommended by your health care provider, drink red wine in moderation. This means 1 glass a day for  nonpregnant women and 2 glasses a day for men. A glass of wine equals 5 oz (150 mL). What foods should I eat? Fruits Apples. Apricots. Avocado. Berries. Bananas. Cherries. Dates. Figs. Grapes. Lemons. Melon. Oranges. Peaches. Plums. Pomegranate. Vegetables Artichokes. Beets. Broccoli. Cabbage. Carrots. Eggplant. Green beans. Chard. Kale. Spinach. Onions. Leeks. Peas. Squash. Tomatoes. Peppers. Radishes. Grains Whole-grain pasta. Brown rice. Bulgur wheat. Polenta. Couscous. Whole-wheat bread. Orpah Cobb. Meats and other proteins Beans. Almonds. Sunflower seeds. Pine nuts. Peanuts. Cod. Salmon. Scallops. Shrimp. Tuna. Tilapia. Clams. Oysters. Eggs. Poultry without skin. Dairy Low-fat milk. Cheese. Greek yogurt. Fats and oils Extra-virgin olive oil. Avocado oil. Grapeseed oil. Beverages Water. Red wine. Herbal tea. Sweets and desserts Greek yogurt with honey. Baked apples. Poached pears. Trail mix. Seasonings and condiments Basil. Cilantro. Coriander. Cumin. Mint. Parsley. Sage. Rosemary. Tarragon. Garlic. Oregano. Thyme. Pepper. Balsamic vinegar. Tahini. Hummus. Tomato sauce. Olives. Mushrooms. The items listed above may not be a complete list of foods and beverages you can eat. Contact a dietitian for more information. What foods should I limit? This is a list of foods that should be eaten rarely or only on special occasions. Fruits Fruit canned in syrup. Vegetables Deep-fried potatoes (french fries). Grains Prepackaged pasta or rice dishes. Prepackaged cereal with added sugar. Prepackaged snacks with added sugar. Meats and other proteins Beef. Pork. Lamb. Poultry with skin. Hot dogs. Tomasa Blase. Dairy Ice cream. Sour cream. Whole milk. Fats and oils Butter. Canola oil. Vegetable oil. Beef fat (tallow). Lard. Beverages Juice. Sugar-sweetened soft drinks. Beer. Liquor and spirits. Sweets and desserts Cookies. Cakes. Pies. Candy. Seasonings and condiments Mayonnaise. Pre-made  sauces and marinades. The items listed above may not be a complete list of foods and beverages you should limit. Contact a dietitian for more information. Summary The Mediterranean diet includes both food and lifestyle choices. Eat a variety of fresh fruits and vegetables, beans, nuts, seeds, and whole grains. Limit the amount of red meat and sweets that you eat. If recommended by your health care provider, drink red wine in moderation. This means 1 glass a day for nonpregnant women and 2 glasses a day for men. A glass of wine equals 5 oz (150 mL). This information is not intended to replace advice given to you by your health care provider. Make sure you discuss any questions you have with your health care provider. Document Revised: 08/06/2019 Document Reviewed: 06/03/2019 Elsevier Patient Education  2023 [employer].

## 2023-01-14 NOTE — Telephone Encounter (Signed)
-----   Message from Beatrice Lecher, [place] sent at 01/13/2023  5:11 PM EDT ----- Statin Rx will reduce LDL more and I think his goal is at least < 70 now. I recommend stopping Red Yeast Rice. Start Rosuvastatin 10 mg once daily. Lipids, LFTs in 3 mos. Tereso Newcomer, PA-C    01/13/2023 5:10 PM

## 2023-01-15 ENCOUNTER — Encounter: Payer: Self-pay | Admitting: Neurology

## 2023-01-18 ENCOUNTER — Ambulatory Visit
Admission: RE | Admit: 2023-01-18 | Discharge: 2023-01-18 | Disposition: A | Discharge status: Home / Self Care | Payer: BC Managed Care – PPO | Source: Ambulatory Visit | Attending: Neurology | Admitting: Neurology

## 2023-01-18 DIAGNOSIS — G319 Degenerative disease of nervous system, unspecified: Secondary | ICD-10-CM | POA: Diagnosis not present

## 2023-01-18 DIAGNOSIS — R413 Other amnesia: Secondary | ICD-10-CM

## 2023-01-18 DIAGNOSIS — R9082 White matter disease, unspecified: Secondary | ICD-10-CM | POA: Diagnosis not present

## 2023-01-18 DIAGNOSIS — H538 Other visual disturbances: Secondary | ICD-10-CM | POA: Diagnosis not present

## 2023-01-20 ENCOUNTER — Telehealth: Payer: Self-pay

## 2023-01-20 DIAGNOSIS — K118 Other diseases of salivary glands: Secondary | ICD-10-CM

## 2023-01-20 NOTE — Telephone Encounter (Signed)
Call report received for the 7/6 MRI.  Please review Impression #[phone]

## 2023-01-20 NOTE — Telephone Encounter (Signed)
-----   Message from Drema Dallas, DO sent at 01/20/2023 10:49 AM EDT ----- MRI of brain shows age-related changes in someone with history of high cholesterol and heart disease.  No evidence of stroke but that does not change management.  Incidentally, there is a mass seen in the parotid gland, which is a gland over the side of the face.  Unclear what it is.  It may be just a cyst but I would like to refer to ENT for further evaluation.

## 2023-01-20 NOTE — Telephone Encounter (Signed)
Patient advised, Referral added.

## 2023-01-21 ENCOUNTER — Encounter: Payer: Self-pay | Admitting: Cardiovascular Disease

## 2023-01-22 NOTE — Telephone Encounter (Signed)
Spoke with patient, he is on our list to add to Dr Morrie Sheldon January ablation schedule once it's available.

## 2023-01-22 NOTE — Addendum Note (Signed)
Addended by: Leida Lauth on: 01/22/2023 02:54 PM   Modules accepted: Orders

## 2023-01-23 ENCOUNTER — Encounter: Payer: Self-pay | Admitting: Cardiovascular Disease

## 2023-01-27 ENCOUNTER — Encounter: Payer: Self-pay | Admitting: Neurology

## 2023-02-09 ENCOUNTER — Other Ambulatory Visit: Payer: Self-pay | Admitting: Physician Assistant

## 2023-02-11 ENCOUNTER — Other Ambulatory Visit: Payer: Self-pay | Admitting: *Deleted

## 2023-02-11 DIAGNOSIS — M79675 Pain in left toe(s): Secondary | ICD-10-CM | POA: Diagnosis not present

## 2023-02-11 MED ORDER — ROSUVASTATIN CALCIUM 10 MG PO TABS: 10.0000 mg | ORAL_TABLET | Freq: Every day | ORAL | 2 refills | Status: DC

## 2023-02-14 DIAGNOSIS — K118 Other diseases of salivary glands: Secondary | ICD-10-CM | POA: Diagnosis not present

## 2023-02-21 ENCOUNTER — Institutional Professional Consult (permissible substitution): Payer: BC Managed Care – PPO | Admitting: Cardiovascular Disease

## 2023-02-28 DIAGNOSIS — M6701 Short Achilles tendon (acquired), right ankle: Secondary | ICD-10-CM | POA: Diagnosis not present

## 2023-02-28 DIAGNOSIS — M71572 Other bursitis, not elsewhere classified, left ankle and foot: Secondary | ICD-10-CM | POA: Diagnosis not present

## 2023-03-20 DIAGNOSIS — M7661 Achilles tendinitis, right leg: Secondary | ICD-10-CM | POA: Diagnosis not present

## 2023-03-20 DIAGNOSIS — M79672 Pain in left foot: Secondary | ICD-10-CM | POA: Diagnosis not present

## 2023-03-24 ENCOUNTER — Telehealth: Payer: Self-pay

## 2023-03-24 DIAGNOSIS — M79672 Pain in left foot: Secondary | ICD-10-CM | POA: Diagnosis not present

## 2023-03-24 DIAGNOSIS — I480 Paroxysmal atrial fibrillation: Secondary | ICD-10-CM

## 2023-03-24 DIAGNOSIS — M7661 Achilles tendinitis, right leg: Secondary | ICD-10-CM | POA: Diagnosis not present

## 2023-03-24 NOTE — Telephone Encounter (Signed)
Spoke with patient, scheduled ablation on 05/13/23, labs to be completed on 04/18/23. No questions at this time.

## 2023-03-26 NOTE — Telephone Encounter (Signed)
Spoke with patient concerning watchman device, this cannot be done same time as the ablation but if wanted to pursue we could refer him to another provider that does this (if [facility] believes to be appropriate). Patient states he doesn't want to go further with it at this time, just thought he could possibly come off of Eliquis sooner. Patient made aware that he will need to stay on Eliquis daily leading up to, and after, ablation until provider instructs him to discontinue. Patient verbalizes understanding, no further questions at this time

## 2023-03-28 DIAGNOSIS — M79672 Pain in left foot: Secondary | ICD-10-CM | POA: Diagnosis not present

## 2023-03-28 DIAGNOSIS — M7661 Achilles tendinitis, right leg: Secondary | ICD-10-CM | POA: Diagnosis not present

## 2023-03-31 DIAGNOSIS — M7661 Achilles tendinitis, right leg: Secondary | ICD-10-CM | POA: Diagnosis not present

## 2023-03-31 DIAGNOSIS — M79672 Pain in left foot: Secondary | ICD-10-CM | POA: Diagnosis not present

## 2023-04-03 DIAGNOSIS — M7661 Achilles tendinitis, right leg: Secondary | ICD-10-CM | POA: Diagnosis not present

## 2023-04-03 DIAGNOSIS — M79672 Pain in left foot: Secondary | ICD-10-CM | POA: Diagnosis not present

## 2023-04-08 DIAGNOSIS — M7661 Achilles tendinitis, right leg: Secondary | ICD-10-CM | POA: Diagnosis not present

## 2023-04-08 DIAGNOSIS — M79672 Pain in left foot: Secondary | ICD-10-CM | POA: Diagnosis not present

## 2023-04-15 DIAGNOSIS — M79672 Pain in left foot: Secondary | ICD-10-CM | POA: Diagnosis not present

## 2023-04-15 DIAGNOSIS — M7661 Achilles tendinitis, right leg: Secondary | ICD-10-CM | POA: Diagnosis not present

## 2023-04-17 ENCOUNTER — Other Ambulatory Visit: Payer: Self-pay | Admitting: Otolaryngology

## 2023-04-17 DIAGNOSIS — M79672 Pain in left foot: Secondary | ICD-10-CM | POA: Diagnosis not present

## 2023-04-17 DIAGNOSIS — M7661 Achilles tendinitis, right leg: Secondary | ICD-10-CM | POA: Diagnosis not present

## 2023-04-17 DIAGNOSIS — K118 Other diseases of salivary glands: Secondary | ICD-10-CM

## 2023-04-18 ENCOUNTER — Ambulatory Visit: Payer: BC Managed Care – PPO | Attending: Physician Assistant

## 2023-04-18 DIAGNOSIS — E7800 Pure hypercholesterolemia, unspecified: Secondary | ICD-10-CM

## 2023-04-18 DIAGNOSIS — I480 Paroxysmal atrial fibrillation: Secondary | ICD-10-CM | POA: Diagnosis not present

## 2023-04-19 LAB — HEPATIC FUNCTION PANEL
ALT: 26 [IU]/L (ref 0–44)
AST: 30 [IU]/L (ref 0–40)
Albumin: 4.7 g/dL (ref 3.9–4.9)
Alkaline Phosphatase: 68 [IU]/L (ref 44–121)
Bilirubin Total: 0.6 mg/dL (ref 0.0–1.2)
Bilirubin, Direct: 0.16 mg/dL (ref 0.00–0.40)
Total Protein: 7 g/dL (ref 6.0–8.5)

## 2023-04-19 LAB — LIPID PANEL
Chol/HDL Ratio: 1.9 {ratio} (ref 0.0–5.0)
Cholesterol, Total: 168 mg/dL (ref 100–199)
HDL: 89 mg/dL (ref >39)
LDL Chol Calc (NIH): 64 mg/dL (ref 0–99)
Triglycerides: 83 mg/dL (ref 0–149)
VLDL Cholesterol Cal: 15 mg/dL (ref 5–40)

## 2023-04-19 LAB — CBC
Hematocrit: 43.6 % (ref 37.5–51.0)
Hemoglobin: 14.3 g/dL (ref 13.0–17.7)
MCH: 30.8 pg (ref 26.6–33.0)
MCHC: 32.8 g/dL (ref 31.5–35.7)
MCV: 94 fL (ref 79–97)
Platelets: 145 10*3/uL — ABNORMAL LOW (ref 150–450)
RBC: 4.64 x10E6/uL (ref 4.14–5.80)
RDW: 12.3 % (ref 11.6–15.4)
WBC: 3.9 10*3/uL (ref 3.4–10.8)

## 2023-04-19 LAB — BASIC METABOLIC PANEL
BUN/Creatinine Ratio: 17 (ref 10–24)
BUN: 16 mg/dL (ref 8–27)
CO2: 26 mmol/L (ref 20–29)
Calcium: 9.8 mg/dL (ref 8.6–10.2)
Chloride: 105 mmol/L (ref 96–106)
Creatinine, Ser: 0.95 mg/dL (ref 0.76–1.27)
Glucose: 72 mg/dL (ref 70–99)
Potassium: 5.1 mmol/L (ref 3.5–5.2)
Sodium: 143 mmol/L (ref 134–144)
eGFR: 88 mL/min/{1.73_m2} (ref >59)

## 2023-04-22 ENCOUNTER — Ambulatory Visit
Admission: RE | Admit: 2023-04-22 | Discharge: 2023-04-22 | Disposition: A | Discharge status: Home / Self Care | Payer: BC Managed Care – PPO | Source: Ambulatory Visit | Attending: Otolaryngology | Admitting: Otolaryngology

## 2023-04-22 DIAGNOSIS — K118 Other diseases of salivary glands: Secondary | ICD-10-CM

## 2023-04-22 DIAGNOSIS — R221 Localized swelling, mass and lump, neck: Secondary | ICD-10-CM | POA: Diagnosis not present

## 2023-04-29 ENCOUNTER — Telehealth (HOSPITAL_COMMUNITY): Payer: Self-pay | Admitting: *Deleted

## 2023-04-29 NOTE — Telephone Encounter (Signed)
Reaching out to patient to offer assistance regarding upcoming cardiac imaging study; pt verbalizes understanding of appt date/time, parking situation and where to check in, pre-test NPO status and verified current allergies; name and call back number provided for further questions should they arise  Michael Brick RN Navigator Cardiac Imaging [facility] [phone] office [phone] cell  Patient aware to arrive at 9am.

## 2023-04-30 ENCOUNTER — Ambulatory Visit (HOSPITAL_COMMUNITY)
Admission: RE | Admit: 2023-04-30 | Discharge: 2023-04-30 | Disposition: A | Discharge status: Home / Self Care | Payer: BC Managed Care – PPO | Source: Ambulatory Visit | Attending: Cardiovascular Disease | Admitting: Cardiovascular Disease

## 2023-04-30 DIAGNOSIS — I480 Paroxysmal atrial fibrillation: Secondary | ICD-10-CM | POA: Insufficient documentation

## 2023-04-30 MED ORDER — IOHEXOL 350 MG/ML SOLN
95.0000 mL | Freq: Once | INTRAVENOUS | Status: AC | PRN
  Administered 2023-04-30: 95 mL via INTRAVENOUS

## 2023-05-06 ENCOUNTER — Other Ambulatory Visit: Payer: Self-pay | Admitting: Otolaryngology

## 2023-05-06 DIAGNOSIS — K118 Other diseases of salivary glands: Secondary | ICD-10-CM

## 2023-05-07 ENCOUNTER — Encounter: Payer: Self-pay | Admitting: Cardiovascular Disease

## 2023-05-11 ENCOUNTER — Other Ambulatory Visit: Payer: Self-pay | Admitting: Cardiovascular Disease

## 2023-05-12 NOTE — Pre-Procedure Instructions (Signed)
Instructed patient on the following items: Arrival time 1130 Nothing to eat or drink after midnight No meds AM of procedure Responsible person to drive you home and stay with you for 24 hrs  Have you missed any doses of anti-coagulant Eliquis- takes twice a day, hasn't missed any doses.  Don't take dose in the morning.

## 2023-05-13 ENCOUNTER — Ambulatory Visit (HOSPITAL_COMMUNITY): Payer: BC Managed Care – PPO

## 2023-05-13 ENCOUNTER — Other Ambulatory Visit: Payer: Self-pay

## 2023-05-13 ENCOUNTER — Ambulatory Visit (HOSPITAL_COMMUNITY)
Admission: RE | Admit: 2023-05-13 | Discharge: 2023-05-13 | Disposition: A | Discharge status: Home / Self Care | Payer: BC Managed Care – PPO | Attending: Cardiovascular Disease | Admitting: Cardiovascular Disease

## 2023-05-13 ENCOUNTER — Encounter (HOSPITAL_COMMUNITY)
Admission: RE | Disposition: A | Discharge status: Home / Self Care | Payer: Self-pay | Source: Home / Self Care | Attending: Cardiovascular Disease

## 2023-05-13 DIAGNOSIS — I480 Paroxysmal atrial fibrillation: Secondary | ICD-10-CM | POA: Diagnosis not present

## 2023-05-13 DIAGNOSIS — I340 Nonrheumatic mitral (valve) insufficiency: Secondary | ICD-10-CM | POA: Insufficient documentation

## 2023-05-13 DIAGNOSIS — R001 Bradycardia, unspecified: Secondary | ICD-10-CM | POA: Diagnosis not present

## 2023-05-13 DIAGNOSIS — I4891 Unspecified atrial fibrillation: Secondary | ICD-10-CM | POA: Diagnosis not present

## 2023-05-13 DIAGNOSIS — I2510 Atherosclerotic heart disease of native coronary artery without angina pectoris: Secondary | ICD-10-CM | POA: Diagnosis not present

## 2023-05-13 DIAGNOSIS — J45909 Unspecified asthma, uncomplicated: Secondary | ICD-10-CM | POA: Diagnosis not present

## 2023-05-13 HISTORY — PX: ATRIAL FIBRILLATION ABLATION: EP1191

## 2023-05-13 SURGERY — ATRIAL FIBRILLATION ABLATION
Anesthesia: General

## 2023-05-13 MED ORDER — HEPARIN SODIUM (PORCINE) 1000 UNIT/ML IJ SOLN
INTRAMUSCULAR | Status: DC | PRN
Start: 2023-05-13 — End: 2023-05-13
  Administered 2023-05-13: 12000 [IU] via INTRAVENOUS
  Administered 2023-05-13: 2000 [IU] via INTRAVENOUS

## 2023-05-13 MED ORDER — LIDOCAINE 2% (20 MG/ML) 5 ML SYRINGE
INTRAMUSCULAR | Status: DC | PRN
  Administered 2023-05-13: 60 mg via INTRAVENOUS

## 2023-05-13 MED ORDER — SUGAMMADEX SODIUM 200 MG/2ML IV SOLN
INTRAVENOUS | Status: DC | PRN
  Administered 2023-05-13: 200 mg via INTRAVENOUS

## 2023-05-13 MED ORDER — SODIUM CHLORIDE 0.9 % IV SOLN: INTRAVENOUS | Status: DC

## 2023-05-13 MED ORDER — ACETAMINOPHEN 500 MG PO TABS
1000.0000 mg | ORAL_TABLET | Freq: Once | ORAL | Status: AC
  Administered 2023-05-13: 1000 mg via ORAL

## 2023-05-13 MED ORDER — PROPOFOL 500 MG/50ML IV EMUL
INTRAVENOUS | Status: DC | PRN
Start: 2023-05-13 — End: 2023-05-13
  Administered 2023-05-13: 50 ug/kg/min via INTRAVENOUS

## 2023-05-13 MED ORDER — PHENYLEPHRINE HCL-NACL 20-0.9 MG/250ML-% IV SOLN
INTRAVENOUS | Status: DC | PRN
  Administered 2023-05-13: 25 ug/min via INTRAVENOUS

## 2023-05-13 MED ORDER — DEXAMETHASONE SODIUM PHOSPHATE 10 MG/ML IJ SOLN
INTRAMUSCULAR | Status: DC | PRN
  Administered 2023-05-13: 10 mg via INTRAVENOUS

## 2023-05-13 MED ORDER — SODIUM CHLORIDE 0.9% FLUSH: 3.0000 mL | INTRAVENOUS | Status: DC | PRN

## 2023-05-13 MED ORDER — SODIUM CHLORIDE 0.9% FLUSH: 3.0000 mL | Freq: Two times a day (BID) | INTRAVENOUS | Status: DC

## 2023-05-13 MED ORDER — FENTANYL CITRATE (PF) 250 MCG/5ML IJ SOLN
INTRAMUSCULAR | Status: DC | PRN
  Administered 2023-05-13 (×2): 25 ug via INTRAVENOUS
  Administered 2023-05-13: 50 ug via INTRAVENOUS

## 2023-05-13 MED ORDER — PROPOFOL 10 MG/ML IV BOLUS
INTRAVENOUS | Status: DC | PRN
  Administered 2023-05-13: 100 mg via INTRAVENOUS

## 2023-05-13 MED ORDER — ROCURONIUM BROMIDE 10 MG/ML (PF) SYRINGE
PREFILLED_SYRINGE | INTRAVENOUS | Status: DC | PRN
  Administered 2023-05-13 (×2): 50 mg via INTRAVENOUS

## 2023-05-13 MED ORDER — SODIUM CHLORIDE 0.9 % IV SOLN: 250.0000 mL | INTRAVENOUS | Status: DC | PRN

## 2023-05-13 MED ORDER — PROTAMINE SULFATE 10 MG/ML IV SOLN
INTRAVENOUS | Status: DC | PRN
  Administered 2023-05-13: 50 mg via INTRAVENOUS

## 2023-05-13 MED ORDER — HEPARIN (PORCINE) IN NACL 1000-0.9 UT/500ML-% IV SOLN
INTRAVENOUS | Status: DC | PRN
  Administered 2023-05-13 (×3): 500 mL

## 2023-05-13 MED ORDER — ONDANSETRON HCL 4 MG/2ML IJ SOLN: 4.0000 mg | Freq: Four times a day (QID) | INTRAMUSCULAR | Status: DC | PRN

## 2023-05-13 MED ORDER — PHENYLEPHRINE 80 MCG/ML (10ML) SYRINGE FOR IV PUSH (FOR BLOOD PRESSURE SUPPORT)
PREFILLED_SYRINGE | INTRAVENOUS | Status: DC | PRN
  Administered 2023-05-13: 40 ug via INTRAVENOUS
  Administered 2023-05-13: 80 ug via INTRAVENOUS

## 2023-05-13 MED ORDER — ACETAMINOPHEN 325 MG PO TABS
650.0000 mg | ORAL_TABLET | ORAL | Status: DC | PRN
Start: 2023-05-13 — End: 2023-05-14

## 2023-05-13 MED ORDER — ONDANSETRON HCL 4 MG/2ML IJ SOLN
INTRAMUSCULAR | Status: DC | PRN
  Administered 2023-05-13: 4 mg via INTRAVENOUS

## 2023-05-13 MED ORDER — ATROPINE SULFATE 1 MG/ML IV SOLN
INTRAVENOUS | Status: DC | PRN
  Administered 2023-05-13: 1 mg via INTRAVENOUS

## 2023-05-13 MED ORDER — ACETAMINOPHEN 500 MG PO TABS
ORAL_TABLET | ORAL | Status: AC
  Filled 2023-05-13: qty 2

## 2023-05-13 SURGICAL SUPPLY — 22 items
BAG SNAP BAND KOVER 36X36 (MISCELLANEOUS) IMPLANT
BLANKET WARM UNDERBOD FULL ACC (MISCELLANEOUS) ×1 IMPLANT
CABLE PFA RX CATH CONN (CABLE) IMPLANT
CATH FARAWAVE ABLATION 31 (CATHETERS) IMPLANT
CATH OCTARAY 2.0 F 3-3-3-3-3 (CATHETERS) IMPLANT
CATH SOUNDSTAR ECO 8FR (CATHETERS) IMPLANT
CATH WEBSTER BI DIR CS D-F CRV (CATHETERS) IMPLANT
CLOSURE PERCLOSE PROSTYLE (VASCULAR PRODUCTS) IMPLANT
COVER SWIFTLINK CONNECTOR (BAG) ×1 IMPLANT
DEVICE CLOSURE MYNXGRIP 6/7F (Vascular Products) IMPLANT
DILATOR VESSEL 38 20CM 16FR (INTRODUCER) IMPLANT
GUIDEWIRE INQWIRE 1.5J.035X260 (WIRE) IMPLANT
INQWIRE 1.5J .035X260CM (WIRE) ×1
PACK EP LATEX FREE (CUSTOM PROCEDURE TRAY) ×1
PACK EP LF (CUSTOM PROCEDURE TRAY) ×1 IMPLANT
PAD DEFIB RADIO PHYSIO CONN (PAD) ×1 IMPLANT
PATCH CARTO3 (PAD) IMPLANT
SHEATH FARADRIVE STEERABLE (SHEATH) IMPLANT
SHEATH PINNACLE 8F 10CM (SHEATH) IMPLANT
SHEATH PINNACLE 9F 10CM (SHEATH) IMPLANT
SHEATH PROBE COVER 6X72 (BAG) IMPLANT
SHEATH WIRE KIT BAYLIS SL1 (KITS) IMPLANT

## 2023-05-13 NOTE — Discharge Instructions (Signed)

## 2023-05-13 NOTE — Anesthesia Procedure Notes (Signed)
Procedure Name: Intubation Date/Time: 05/13/2023 1:15 PM  Performed by: Beryle Lathe, MDPre-anesthesia Checklist: Patient identified, Emergency Drugs available, Suction available and Patient being monitored Patient Re-evaluated:Patient Re-evaluated prior to induction Oxygen Delivery Method: Circle system utilized Preoxygenation: Pre-oxygenation with 100% oxygen Induction Type: IV induction Ventilation: Mask ventilation without difficulty Laryngoscope Size: Glidescope and 3 Grade View: Grade I Tube type: Oral Number of attempts: 2 Airway Equipment and Method: Stylet and Oral airway Placement Confirmation: ETT inserted through vocal cords under direct vision, positive ETCO2 and breath sounds checked- equal and bilateral Secured at: 23 cm Tube secured with: Tape Dental Injury: Teeth and Oropharynx as per pre-operative assessment

## 2023-05-13 NOTE — H&P (Signed)
  Electrophysiology Office Note:    Date:  05/13/2023   ID:  Michael Oneal, DOB 19-May-1956, MRN [id number]  PCP:  Patient, No Pcp Per   [facility] Providers Cardiologist:  Verne Carrow, MD     Referring MD: No ref. provider found   History of Present Illness:    Michael Oneal is a 67 y.o. male with a medical history significant for atrial fibrillation, moderate MR, presence of coronary calcium on CT, possible TIA referred for AF management.     He has experienced brief palpitations for years. It wasn't until July 18 2022 that episodes began to last longer than a few seconds or minutes. A Zio monitor was placed that confirmed the diagnosis of AF. He has had a few episodes intermittently over the past 6 months.   He is athletic, exercises every morning -- either jogging or cycling. Since starting anticoagulation, he has not been riding outdoors. His exertional capacity is significantly reduced when in AF.  A few weeks ago he experienced a visual disturbance thought to be due to TIA possibly.      He reports he is doing well today. He presents for ablation.  I reviewed the patient's CT and labs. There was no LAA thrombus. he  has not missed any doses of anticoagulation, and he took his dose last night. There have been no changes in the patient's diagnoses, medications, or condition since our recent clinic visit.   EKGs/Labs/Other Studies Reviewed Today:    Echocardiogram:  TTE 11/18/2022 EF 67%. Moderate MR   Monitors:  14 day Zio -- my interpretation Sinus rhythm, HR 36-147 <1% AF burden, rates 90-148, avg 134 Patient triggered events were associated with sinus rhythm and episodes of AF  Stress testing:   Advanced imaging:   Cardiac catherization   EKG:         Physical Exam:    VS:  BP 139/87   Pulse (!) 44   Temp 97.7 F (36.5 C) (Oral)   Resp 17   Ht 5\' 11"  (1.803 m)   Wt 68 kg   SpO2 100%   BMI 20.92 kg/m     Wt Readings from  Last 3 Encounters:  05/13/23 68 kg  01/14/23 68.9 kg  01/13/23 70.3 kg     GEN:  Well nourished, well developed in no acute distress CARDIAC: brady RRR, no murmurs, rubs, gallops RESPIRATORY:  Normal work of breathing MUSCULOSKELETAL: no edema    ASSESSMENT & PLAN:    Atrial fibrillation Paroxysmal, symptomatic Rates are uncontrolled in AF; he has baseline bradycardia I think rhythm control is warranted. We discussed options  We discussed the indication, rationale, logistics, anticipated benefits, and potential risks of the ablation procedure including but not limited to -- bleed at the groin access site, chest pain, damage to nearby organs such as the diaphragm, lungs, or esophagus, need for a drainage tube, or prolonged hospitalization. I explained that the risk for stroke, heart attack, need for open chest surgery, or even death is very low but not zero. he  expressed understanding and wishes to proceed.  Sinus bradycardia Suspect increased vagal tone due to athleticism.    Signed, Maurice Small, MD  05/13/2023 12:32 PM    [facility]

## 2023-05-13 NOTE — Transfer of Care (Signed)
Immediate Anesthesia Transfer of Care Note  Patient: Michael Oneal  Procedure(s) Performed: ATRIAL FIBRILLATION ABLATION  Patient Location: PACU and Cath Lab  Anesthesia Type:General  Level of Consciousness: awake  Airway & Oxygen Therapy: Patient Spontanous Breathing  Post-op Assessment: Report given to RN  Post vital signs: stable  Last Vitals:  Vitals Value Taken Time  BP    Temp    Pulse    Resp    SpO2      Last Pain:  Vitals:   05/13/23 1237  TempSrc:   PainSc: 0-No pain         Complications: There were no known notable events for this encounter.

## 2023-05-13 NOTE — Anesthesia Preprocedure Evaluation (Signed)
Anesthesia Evaluation  Patient identified by MRN, date of birth, ID band Patient awake    Reviewed: Allergy & Precautions, NPO status , Patient's Chart, lab work & pertinent test results  History of Anesthesia Complications Negative for: history of anesthetic complications  Airway Mallampati: II  TM Distance: >3 FB Neck ROM: Full    Dental  (+) Dental Advisory Given, Chipped   Pulmonary asthma    Pulmonary exam normal        Cardiovascular + CAD  Normal cardiovascular exam+ dysrhythmias Atrial Fibrillation + Valvular Problems/Murmurs MR    '24 Carotid US - b/l 1-39% ICAS  '24 TTE - EF 67 %. The right ventricular size is mildly enlarged. Moderate mitral valve  regurgitation. Tricuspid valve regurgitation is mild to moderate. Aortic valve regurgitation is mild.     Neuro/Psych TIA negative psych ROS   GI/Hepatic Neg liver ROS, hiatal hernia,,,  Endo/Other  negative endocrine ROS    Renal/GU negative Renal ROS     Musculoskeletal negative musculoskeletal ROS (+)    Abdominal   Peds  Hematology  On eliquis    Anesthesia Other Findings   Reproductive/Obstetrics                              Anesthesia Physical Anesthesia Plan  ASA: 3  Anesthesia Plan: General   Post-op Pain Management: Tylenol PO (pre-op)*   Induction: Intravenous  PONV Risk Score and Plan: 2 and Treatment may vary due to age or medical condition, Ondansetron and Dexamethasone  Airway Management Planned: Oral ETT  Additional Equipment: None  Intra-op Plan:   Post-operative Plan: Extubation in OR  Informed Consent:   Plan Discussed with: CRNA and Anesthesiologist  Anesthesia Plan Comments:          Anesthesia Quick Evaluation

## 2023-05-13 NOTE — Progress Notes (Signed)
Dr. Nelly Laurence called due to patient unable to void. Instructed to increase po fluids till able to void.

## 2023-05-14 ENCOUNTER — Encounter (HOSPITAL_COMMUNITY): Payer: Self-pay | Admitting: Cardiovascular Disease

## 2023-05-14 LAB — POCT ACTIVATED CLOTTING TIME: Activated Clotting Time: 293 s

## 2023-05-14 NOTE — Anesthesia Postprocedure Evaluation (Signed)
Anesthesia Post Note  Patient: Michael Oneal  Procedure(s) Performed: ATRIAL FIBRILLATION ABLATION     Patient location during evaluation: PACU Anesthesia Type: General Level of consciousness: awake and alert Pain management: pain level controlled Vital Signs Assessment: post-procedure vital signs reviewed and stable Respiratory status: spontaneous breathing, nonlabored ventilation and respiratory function stable Cardiovascular status: stable and blood pressure returned to baseline Anesthetic complications: no   There were no known notable events for this encounter.  Last Vitals:  Vitals:   05/13/23 1800 05/13/23 1850  BP: 112/73 113/68  Pulse:    Resp: 16 13  Temp:    SpO2:      Last Pain:  Vitals:   05/13/23 1850  TempSrc:   PainSc: 0-No pain                 Beryle Lathe

## 2023-05-16 ENCOUNTER — Ambulatory Visit
Admission: RE | Admit: 2023-05-16 | Discharge: 2023-05-16 | Disposition: A | Discharge status: Home / Self Care | Payer: BC Managed Care – PPO | Source: Ambulatory Visit | Attending: Otolaryngology

## 2023-05-16 DIAGNOSIS — K118 Other diseases of salivary glands: Secondary | ICD-10-CM | POA: Diagnosis not present

## 2023-05-16 MED ORDER — IOPAMIDOL (ISOVUE-300) INJECTION 61%
500.0000 mL | Freq: Once | INTRAVENOUS | Status: AC | PRN
Start: 2023-05-16 — End: 2023-05-16
  Administered 2023-05-16: 75 mL via INTRAVENOUS

## 2023-05-30 DIAGNOSIS — M7661 Achilles tendinitis, right leg: Secondary | ICD-10-CM | POA: Diagnosis not present

## 2023-06-03 DIAGNOSIS — L570 Actinic keratosis: Secondary | ICD-10-CM | POA: Diagnosis not present

## 2023-06-03 DIAGNOSIS — L820 Inflamed seborrheic keratosis: Secondary | ICD-10-CM | POA: Diagnosis not present

## 2023-06-03 DIAGNOSIS — L821 Other seborrheic keratosis: Secondary | ICD-10-CM | POA: Diagnosis not present

## 2023-06-04 DIAGNOSIS — K118 Other diseases of salivary glands: Secondary | ICD-10-CM | POA: Diagnosis not present

## 2023-06-10 ENCOUNTER — Encounter (HOSPITAL_COMMUNITY): Payer: Self-pay | Admitting: Physician Assistant

## 2023-06-10 ENCOUNTER — Ambulatory Visit (HOSPITAL_COMMUNITY)
Admission: RE | Admit: 2023-06-10 | Discharge: 2023-06-10 | Disposition: A | Discharge status: Home / Self Care | Payer: BC Managed Care – PPO | Source: Ambulatory Visit | Attending: Physician Assistant | Admitting: Physician Assistant

## 2023-06-10 VITALS — BP 112/82 | HR 63 | Ht 71.0 in | Wt 157.0 lb

## 2023-06-10 DIAGNOSIS — Z79899 Other long term (current) drug therapy: Secondary | ICD-10-CM | POA: Insufficient documentation

## 2023-06-10 DIAGNOSIS — I480 Paroxysmal atrial fibrillation: Secondary | ICD-10-CM

## 2023-06-10 DIAGNOSIS — R9431 Abnormal electrocardiogram [ECG] [EKG]: Secondary | ICD-10-CM | POA: Insufficient documentation

## 2023-06-10 DIAGNOSIS — I2510 Atherosclerotic heart disease of native coronary artery without angina pectoris: Secondary | ICD-10-CM | POA: Diagnosis not present

## 2023-06-10 DIAGNOSIS — Z7901 Long term (current) use of anticoagulants: Secondary | ICD-10-CM | POA: Insufficient documentation

## 2023-06-10 DIAGNOSIS — D6869 Other thrombophilia: Secondary | ICD-10-CM

## 2023-06-10 DIAGNOSIS — Z8673 Personal history of transient ischemic attack (TIA), and cerebral infarction without residual deficits: Secondary | ICD-10-CM | POA: Insufficient documentation

## 2023-06-10 DIAGNOSIS — E785 Hyperlipidemia, unspecified: Secondary | ICD-10-CM | POA: Diagnosis not present

## 2023-06-10 DIAGNOSIS — I340 Nonrheumatic mitral (valve) insufficiency: Secondary | ICD-10-CM | POA: Diagnosis not present

## 2023-06-10 NOTE — Progress Notes (Signed)
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 male with a history of CAD, moderate MR, HLD, TIA, atrial fibrillation who presents for follow up in the [facility]. He has experienced brief palpitations for years. It wasn't until July 18 2022 that episodes began to last longer than a few seconds or minutes. A Zio monitor was placed that confirmed the diagnosis of AF. Patient was seen by Dr Michael Oneal and underwent afib ablation on 05/13/23. Patient is on Eliquis for a CHADS2VASC score of 4.  On follow up today, patient reports that he has done well since his ablation. He denies any interim symptoms of afib. He denies chest pain or groin issues.   Today, he denies symptoms of palpitations, chest pain, shortness of breath, orthopnea, PND, lower extremity edema, dizziness, presyncope, syncope, snoring, daytime somnolence, bleeding, or neurologic sequela. The patient is tolerating medications without difficulties and is otherwise without complaint today.    Atrial Fibrillation Risk Factors:  he does not have symptoms or diagnosis of sleep apnea. he does not have a history of rheumatic fever. he does have a history of alcohol use.   Atrial Fibrillation Management history:  Previous antiarrhythmic drugs: none Previous cardioversions: none Previous ablations: 05/13/23 Anticoagulation history: Eliquis  ROS- All systems are reviewed and negative except as per the HPI above.  Past Medical History:  Diagnosis Date   Allergy    Asthma    Exercise induced asthma   CAD (coronary artery disease) 12/29/2022   Coronary Ca2+ on CT   Fatigue    History of hiatal hernia    noted on CT scan   History of shingles 10/12/2014   Hyperlipidemia    Mitral regurgitation 12/29/2022   TTE 11/18/22: EF 67, no REMA, NL RVSF, NL PASP,  mod MR, mild to mod TR, mild AI, RAP 3   Other abnormalities of heart beat    PAF (paroxysmal atrial fibrillation) (HCC) 12/29/2022   Monitor 11/2022: NSR, avg 58, 3 SV runs (longest 11 beats), <1% burden AFib/Flutter, rare PVCs, PACs   Palpitations    occ. random, resolves in a few minutes   Pneumothorax 05/04/2022   Mild, after injury   Right hamstring muscle strain 10/10/2014   Skin cancer    Trigeminal neuralgia     Current Outpatient Medications  Medication Sig Dispense Refill   apixaban (ELIQUIS) 5 MG TABS tablet Take 1 tablet (5 mg total) by mouth 2 (two) times daily. 180 tablet 3   Flaxseed, Linseed, (FLAX SEEDS PO) Take 1,000 mg by mouth 2 (two) times daily.     Omega-3 Fatty Acids (FISH OIL) 1200 MG CAPS Take 1,200 mg by mouth 2 (two) times daily.     rosuvastatin (CRESTOR) 10 MG tablet TAKE 1 TABLET BY MOUTH EVERYDAY. PLEASE KEEP UPCOMING APPOINTMENT FOR REFILLS 30 tablet 2   No current facility-administered medications for this encounter.    Physical Exam: BP 112/82   Pulse 63   Ht 5\' 11"  (1.803 m)   Wt 71.2 kg   BMI 21.90 kg/m   GEN: Well nourished, well developed in no acute distress NECK: No JVD; No carotid bruits CARDIAC: Regular rate and rhythm, no murmurs, rubs, gallops RESPIRATORY:  Clear to auscultation without rales, wheezing or rhonchi  ABDOMEN: Soft, non-tender, non-distended EXTREMITIES:  No edema; No deformity   Wt Readings from Last  3 Encounters:  06/10/23 71.2 kg  05/13/23 68 kg  01/14/23 68.9 kg     EKG today demonstrates  SR Vent. rate 63 BPM PR interval 146 ms QRS duration 88 ms QT/QTcB 408/417 ms  Echo 11/18/22 demonstrated  1. Left ventricular ejection fraction by 3D volume is 67 %. The left  ventricle has normal function. The left ventricle has no regional wall  motion abnormalities. Left ventricular diastolic parameters are  indeterminate.   2. Right ventricular systolic function is normal. The right ventricular  size is mildly  enlarged. There is normal pulmonary artery systolic  pressure.   3. The mitral valve is normal in structure. Moderate mitral valve  regurgitation. No evidence of mitral stenosis.   4. Tricuspid valve regurgitation is mild to moderate.   5. The aortic valve is normal in structure. Aortic valve regurgitation is  mild. No aortic stenosis is present.   6. The inferior vena cava is normal in size with greater than 50%  respiratory variability, suggesting right atrial pressure of 3 mmHg.    CHA2DS2-VASc Score = 4  The patient's score is based upon: CHF History: 0 HTN History: 0 Diabetes History: 0 Stroke History: 2 (possible TIA) Vascular Disease History: 1 Age Score: 1 Gender Score: 0       ASSESSMENT AND PLAN: Paroxysmal Atrial Fibrillation (ICD10:  I48.0) The patient's CHA2DS2-VASc score is 4, indicating a 4.8% annual risk of stroke.   S/p afib ablation 05/13/23 Patient appears to be maintaining SR Continue Eliquis 5 mg BID for at least 3 months post ablation with no missed doses. Patient would like to be off this medication long term due to his active lifestyle. We briefly discussed Watchman.   Secondary Hypercoagulable State (ICD10:  D68.69) The patient is at significant risk for stroke/thromboembolism based upon his CHA2DS2-VASc Score of 4.  Continue Apixaban (Eliquis).   CAD CAC score 1167 On statin No anginal symptoms   Follow up with Francis Dowse as scheduled.        Jorja Loa PA-C [facility] [address] [place], [place] [address] [phone]

## 2023-07-17 NOTE — Progress Notes (Signed)
 NEUROLOGY FOLLOW UP OFFICE NOTE  Michael Oneal [id number]  Assessment/Plan:   Transient ischemic attack,likely cardioembolic due to paroxysmal atrial fibrillation - probable central retinal artery occlusion but cannot rule out occipital Infarct as he is not absolutely certain it was monocular. Atypical facial pain - may be a herpes-related trigeminal neuralgia Paroxysmal atrial fibrillation Hyperlipidemia   Continue secondary stroke prevention as per cardiology (advised to establish care with PCP - provided contacts): Eliquis  Statin.  LDL goal less than 70 Normotensive blood pressure Hgb A1c goal less than 7 If he had worsening flare ups of facial pain, he will contact me Follow up as needed  Total time spent in chart and face to face with patient:  22 minutes   Subjective:  Michael Oneal is a 68 year old right-handed male with PAF, CAD, mitral regurgitation, and HLD who follows up for transient ischemic attack.  He is accompanied by his wife who supplements history.  UPDATE: Current medications:  Eliquis , rosuvastatin  10mg   MRI of brain with and without contrast on 01/18/2023, personally reviewed, revealed mild generalized atrophy and chronic small vessel ischemic changes within the bilateral cerebral white matter but no acute/subacute findings.  Incidentally, a parotid gland lesion was seen and he was referred to ENT.  Currently undergoing evaluation.  Lipid panel from 04/18/2023 revealed LDL 64.  No recurrent symptoms.  He reports history of atypical facial/head pain for 50 years.  Usually left sided but has been right sided for last 2 or 3 years.  He may have unilateral burning sensation in the face and paroxysmal stabbing pain over the temporalis region.  Would usually occur off and on for a day and often will occur once a year.  Occurs when he gets a viral illness such as a cold.  Recently, he had a bout that lasted 5 days.    50 years - left sided but now right sided last 2-3  years.  Brief every few months.  Recently episode for 5 days = viral`   HISTORY: In January 2024, he began experiencing recurrent episodes of palpitations.  2D echo on 5/6 revealed EF 67% with moderate mitral valve regurgitation and no atrial level shunt.  In mid-June, he was at work and started having vision problems in his left eye.  He noted a jagged shaped afterimage that turned beige in the central vision.  He closed either eye and thinks it was seen only in his left eye.  Lasted 15 minutes.  No associated headache, slurred speech, facial droop or unilateral numbness or weakness.  Immediately saw his eye doctor with unremarkable exam.  CT head on 6/17 personally reviewed showed chronic small vessel ischemic changes but no acute intracranial abnormality.  He was subsequently started on Eliquis .  Carotid doppler on 6/28 revealed no hemodynamically significant stenosis.  LDL from April was 79.  Glucose was 78.    No history of migrianes  PAST MEDICAL HISTORY: Past Medical History:  Diagnosis Date   Allergy    Asthma    Exercise induced asthma   CAD (coronary artery disease) 12/29/2022   Coronary Ca2+ on CT   Fatigue    History of hiatal hernia    noted on CT scan   History of shingles 10/12/2014   Hyperlipidemia    Mitral regurgitation 12/29/2022   TTE 11/18/22: EF 67, no REMA, NL RVSF, NL PASP, mod MR, mild to mod TR, mild AI, RAP 3   Other abnormalities of heart beat    PAF (paroxysmal  atrial fibrillation) (HCC) 12/29/2022   Monitor 11/2022: NSR, avg 58, 3 SV runs (longest 11 beats), <1% burden AFib/Flutter, rare PVCs, PACs   Palpitations    occ. random, resolves in a few minutes   Pneumothorax 05/04/2022   Mild, after injury   Right hamstring muscle strain 10/10/2014   Skin cancer    Trigeminal neuralgia     MEDICATIONS: Current Outpatient Medications on File Prior to Visit  Medication Sig Dispense Refill   apixaban  (ELIQUIS ) 5 MG TABS tablet Take 1 tablet (5 mg total) by  mouth 2 (two) times daily. 180 tablet 3   Flaxseed, Linseed, (FLAX SEEDS PO) Take 1,000 mg by mouth 2 (two) times daily.     Omega-3 Fatty Acids (FISH OIL) 1200 MG CAPS Take 1,200 mg by mouth 2 (two) times daily.     rosuvastatin  (CRESTOR ) 10 MG tablet TAKE 1 TABLET BY MOUTH EVERYDAY. PLEASE KEEP UPCOMING APPOINTMENT FOR REFILLS 30 tablet 2   No current facility-administered medications on file prior to visit.    ALLERGIES: Allergies  Allergen Reactions   Macadamia Nut Oil Anaphylaxis    FAMILY HISTORY: Family History  Problem Relation Age of Onset   Stroke Mother    Heart disease Mother        Valve disease/Rheumatic heart disease   Hyperlipidemia Mother    Coronary artery disease Mother    Heart disease Father    Heart attack Father        Heavy smoker      Objective:  Blood pressure 131/87, pulse 62, height 5' 11 (1.803 m), weight 160 lb (72.6 kg). General: No acute distress.  Patient appears well-groomed.   Head:  Normocephalic/atraumatic Eyes:  Fundi examined but not visualized Heart:  regular rate and rhythm Neurological Exam: alert and oriented.  Speech fluent and not dysarthric, language intact.  CN II-XII intact. Bulk and tone normal, muscle strength 5/5 throughout.  Sensation to light touch intact.  Deep tendon reflexes 2+ throughout, toes downgoing.  Finger to nose testing intact.  Gait normal, Romberg negative.   Juliene Dunnings, DO

## 2023-07-18 ENCOUNTER — Encounter: Payer: Self-pay | Admitting: Neurology

## 2023-07-18 ENCOUNTER — Ambulatory Visit (INDEPENDENT_AMBULATORY_CARE_PROVIDER_SITE_OTHER): Payer: Medicare Other | Admitting: Neurology

## 2023-07-18 VITALS — BP 131/87 | HR 62 | Ht 71.0 in | Wt 160.0 lb

## 2023-07-18 DIAGNOSIS — G459 Transient cerebral ischemic attack, unspecified: Secondary | ICD-10-CM | POA: Diagnosis not present

## 2023-07-18 DIAGNOSIS — I480 Paroxysmal atrial fibrillation: Secondary | ICD-10-CM

## 2023-07-18 DIAGNOSIS — E785 Hyperlipidemia, unspecified: Secondary | ICD-10-CM | POA: Diagnosis not present

## 2023-07-18 DIAGNOSIS — G501 Atypical facial pain: Secondary | ICD-10-CM

## 2023-07-22 ENCOUNTER — Ambulatory Visit: Payer: BC Managed Care – PPO | Admitting: Neurology

## 2023-08-10 ENCOUNTER — Other Ambulatory Visit: Payer: Self-pay | Admitting: Cardiovascular Disease

## 2023-08-14 NOTE — Progress Notes (Signed)
Cardiology Office Note:  .   Date:  08/14/2023  ID:  Michael Oneal, DOB 1956-06-06, MRN [id number] PCP: Patient, No Pcp Per  [facility] Providers Cardiologist:  Verne Carrow, MD Electrophysiologist:  Maurice Small, MD {  History of Present Illness: .   Michael Oneal is a 68 y.o. male w/PMHx of HLD, AFib, coronary Ca++ on CT, TIA  He saw S. Weaver 12/30/22, found with AFib, recent visual changed that was discussed perhaps atypical migraine vs TIA, planned for CT if no bleeding >> start OAC > neurology/carotis [place]  Referred to Dr. Nelly Laurence, saw him 01/13/23 discussed baseline bradycardia (suspected 2/2 athleticism) discussed management options, planned for ablation  Saw neurology 01/14/23, likely TIA w/Afib, probable central retinal artery occlusion but cannot rule out occipital infarct as he is not absolutely certain it was monocular.  Planned for MRI MPRESSION: 1.  No evidence of an acute intracranial abnormality. 2. Multifocal T2 FLAIR hyperintense signal abnormality within the cerebral white matter, mild but greater than expected for age. Findings are nonspecific, but most often secondary to chronic small vessel ischemia. 3. Mild generalized parenchymal atrophy. 4. Minimal mucosal thickening within the bilateral maxillary sinuses. 5. 8 mm T2 hyperintense lesion within the deep lobe of the right parotid gland, which may reflect a cyst or a primary parotid neoplasm. ENT consultation recommended  >>> ENT CT IMPRESSION: 9 mm mass in the right parotid behind the retromandibular vein and lateral to the styloid process. No specific features, this could reflect a cyst, facial nerve schwannoma, or small salivary neoplasm.  Today's visit is scheduled as his post ablation visit  ROS:   He comes today accompanied by his wife He denies any procedure site concerns, healing issues. He was very aware of his Afib and is certain that post ablation he has not had any. He did  note that his resting heart rate post procedure was unusually elevated for him, but has settled back into the 40's where he is used to it. He is a long time runner, cycling, looking forward to a skiing trip soon.  Denies CP, SOB No near syncope or syncope No SOB  He/his wife are concerned about his coronary Ca++ score > pending f/u with Wende Mott soon to discuss management strategies  He does not want to stay on Eliquis. He is not in agreement with neurology that he likely had TIA, by his personal reading and investigation doesn't think his symptoms are in alignment with the diagnosis  He has been told of watchman as an option, mentions a retired CTS friend did not seem to think it was a good idea, but he thinks he would like to discuss it further for him He would like to go skiing, cycling, stay very active without worrying about being on a blood thinner. This is a reasonable option I think for discussion In the meantime, I do recommend that he stay on the Eliquis.   Arrhythmia/AAD hx AFib found April 2024 AFib ablation  (PFA)05/13/23  Studies Reviewed: Marland Kitchen    EKG done today and reviewed by myself:  SB 46bpm  05/13/23: EPS/ablation CONCLUSIONS: 1. Sinus rhythm upon presentation.   2. Successful electrical isolation and anatomical encircling of all four pulmonary veins with pulse field ablation. 3. Ablation of the posterior wall  4.No early apparent complications.   TTE 11/18/2022 EF 67%. Moderate MR     April 2024 14 day Zio -- my interpretation Sinus rhythm, HR 36-147 <1% AF burden, rates 90-148, avg  134 Patient triggered events were associated with sinus rhythm and episodes of AF   Risk Assessment/Calculations:    Physical Exam:   VS:  There were no vitals taken for this visit.   Wt Readings from Last 3 Encounters:  07/18/23 160 lb (72.6 kg)  06/10/23 157 lb (71.2 kg)  05/13/23 150 lb (68 kg)    GEN: Well nourished, well developed in no acute distress NECK: No  JVD; No carotid bruits CARDIAC: RRR, no murmurs, rubs, gallops RESPIRATORY:  CTA b/l without rales, wheezing or rhonchi  ABDOMEN: Soft, non-tender, non-distended EXTREMITIES:  No edema; No deformity   ASSESSMENT AND PLAN: .    paroxysmal AFib CHA2DS2Vasc is 3, on Eliquis, appropriately dosed  Discussed above he is not 100% agreeable staying on Eliquis indefinately, though seems like he will in the short term at least He has not had any more Afib and certain that he would know, his symptoms were not subtle He is going skiing and fearful of traumatic bleeding, with plans of pausing his OAC during his trip at least. This is his plan, asks how to do that safely. Discussed not ideal, again w/his hx TIA >> though similar to pre-op strategy would hold 2 days ahead and resume as soon as done.  I provided him watchman pamphlet he will discuss with Lorin Picket, but likely plan to pursue consult with one of our implanters  Secondary hypercoagulable state 2/2 AFib   today in chart review, discussions with the patient/his wife, and note  Dispo: back with Dr. Vassie Moment team otherwise in 52mo, sooner if needed  Signed, Munirah Doerner Norberto Sorenson, PA-C

## 2023-08-15 ENCOUNTER — Ambulatory Visit: Payer: Medicare Other | Attending: Physician Assistant | Admitting: Physician Assistant

## 2023-08-15 ENCOUNTER — Encounter: Payer: Self-pay | Admitting: Physician Assistant

## 2023-08-15 VITALS — BP 146/86 | HR 46 | Ht 71.0 in | Wt 161.0 lb

## 2023-08-15 DIAGNOSIS — D6869 Other thrombophilia: Secondary | ICD-10-CM | POA: Diagnosis present

## 2023-08-15 DIAGNOSIS — I480 Paroxysmal atrial fibrillation: Secondary | ICD-10-CM | POA: Insufficient documentation

## 2023-08-15 NOTE — Patient Instructions (Addendum)
Medication Instructions:   Your physician recommends that you continue on your current medications as directed. Please refer to the Current Medication list given to you today.   *If you need a refill on your cardiac medications before your next appointment, please call your pharmacy*   Lab Work: NONE ORDERED  TODAY    If you have labs (blood work) drawn today and your tests are completely normal, you will receive your results only by: MyChart Message (if you have MyChart) OR A paper copy in the mail If you have any lab test that is abnormal or we need to change your treatment, we will call you to review the results.   Testing/Procedures: NONE ORDERED  TODAY     Follow-Up: At [facility], you and your health needs are our priority.  As part of our continuing mission to provide you with exceptional heart care, we have created designated Provider Care Teams.  These Care Teams include your primary Cardiologist (physician) and Advanced Practice Providers (APPs -  Physician Assistants and Nurse Practitioners) who all work together to provide you with the care you need, when you need it.  We recommend signing up for the patient portal called "MyChart".  Sign up information is provided on this After Visit Summary.  MyChart is used to connect with patients for Virtual Visits (Telemedicine).  Patients are able to view lab/test results, encounter notes, upcoming appointments, etc.  Non-urgent messages can be sent to your provider as well.   To learn more about what you can do with MyChart, go to [email or web address].    Your next appointment:   6 month(s)  Provider:   You may see Maurice Small, MD or one of the following Advanced Practice Providers on your designated Care Team:   Francis Dowse, [place]   Other Instructions

## 2023-08-29 ENCOUNTER — Ambulatory Visit: Payer: BC Managed Care – PPO | Admitting: Physician Assistant
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
## 2023-09-03 ENCOUNTER — Ambulatory Visit: Payer: Medicare Other | Attending: Physician Assistant | Admitting: Physician Assistant

## 2023-09-03 ENCOUNTER — Encounter: Payer: Self-pay | Admitting: Physician Assistant

## 2023-09-03 ENCOUNTER — Other Ambulatory Visit: Payer: Self-pay | Admitting: *Deleted

## 2023-09-03 VITALS — BP 123/64 | HR 66 | Ht 71.0 in | Wt 158.6 lb

## 2023-09-03 DIAGNOSIS — I340 Nonrheumatic mitral (valve) insufficiency: Secondary | ICD-10-CM

## 2023-09-03 DIAGNOSIS — I2510 Atherosclerotic heart disease of native coronary artery without angina pectoris: Secondary | ICD-10-CM

## 2023-09-03 DIAGNOSIS — G459 Transient cerebral ischemic attack, unspecified: Secondary | ICD-10-CM

## 2023-09-03 DIAGNOSIS — I480 Paroxysmal atrial fibrillation: Secondary | ICD-10-CM

## 2023-09-03 DIAGNOSIS — E7800 Pure hypercholesterolemia, unspecified: Secondary | ICD-10-CM

## 2023-09-03 NOTE — Assessment & Plan Note (Addendum)
CAC score 1167.  This placed him in the 92nd percentile.  He is very active without anginal symptoms.  Therefore I do not think he needs stress testing at this time.  He does not need antiplatelet therapy as he is on Eliquis.  We discussed the importance of aggressive risk factor management. -Continue Crestor 10 mg daily -Arrange CMET, lipids prior to next visit

## 2023-09-03 NOTE — Assessment & Plan Note (Addendum)
Status post PVI ablation in October 2024.  He has been asymptomatic since that time.  He is very active.  He rides a bike for several miles on a regular basis.  He also skis a lot. He has been avoiding biking since he started on Eliquis.  He would like to get back to this lifestyle.  He is interested in pursuing Watchman to allow him to stop anticoagulation.  I will refer him to Dr. Lalla Brothers for consideration of left atrial appendage occlusion device.  We discussed the importance of continuing Eliquis for now. -Continue Eliquis 5 mg twice daily -Refer to EP (Dr. Lalla Brothers) for consideration of LAAOD (Watchman).

## 2023-09-03 NOTE — Patient Instructions (Signed)
Medication Instructions:  Your physician recommends that you continue on your current medications as directed. Please refer to the Current Medication list given to you today.    *If you need a refill on your cardiac medications before your next appointment, please call your pharmacy*   Lab Work: 1 WEEK BEFORE YOUR APPT WITH DR. Clifton James IN [facility], GO TO A [facility] NEAR YOU, FASTING, FOR:  CMET & LIPID  If you have labs (blood work) drawn today and your tests are completely normal, you will receive your results only by: MyChart Message (if you have MyChart) OR A paper copy in the mail If you have any lab test that is abnormal or we need to change your treatment, we will call you to review the results.   Testing/Procedures: Your physician has requested that you have an echocardiogram 1st of May 2025.Marland Kitchen Echocardiography is a painless test that uses sound waves to create images of your heart. It provides your doctor with information about the size and shape of your heart and how well your heart's chambers and valves are working. This procedure takes approximately one hour. There are no restrictions for this procedure. Please do NOT wear cologne, perfume, aftershave, or lotions (deodorant is allowed). Please arrive 15 minutes prior to your appointment time.  Please note: We ask at that you not bring children with you during ultrasound (echo/ vascular) testing. Due to room size and safety concerns, children are not allowed in the ultrasound rooms during exams. Our front office staff cannot provide observation of children in our lobby area while testing is being conducted. An adult accompanying a patient to their appointment will only be allowed in the ultrasound room at the discretion of the ultrasound technician under special circumstances. We apologize for any inconvenience.    You have been referred to Dr. Lalla Brothers to discuss a Watchman Device.   Follow-Up: At [facility], you and  your health needs are our priority.  As part of our continuing mission to provide you with exceptional heart care, we have created designated Provider Care Teams.  These Care Teams include your primary Cardiologist (physician) and Advanced Practice Providers (APPs -  Physician Assistants and Nurse Practitioners) who all work together to provide you with the care you need, when you need it.  We recommend signing up for the patient portal called "MyChart".  Sign up information is provided on this After Visit Summary.  MyChart is used to connect with patients for Virtual Visits (Telemedicine).  Patients are able to view lab/test results, encounter notes, upcoming appointments, etc.  Non-urgent messages can be sent to your provider as well.   To learn more about what you can do with MyChart, go to [email or web address].    Your next appointment:   As scheduled   Provider:   Verne Carrow, MD  or Tereso Newcomer, PA-C         Other Instructions     1st Floor: - Lobby - Registration  - Pharmacy  - Lab - Cafe  2nd Floor: - PV Lab - Diagnostic Testing (echo, CT, nuclear med)  3rd Floor: - Vacant  4th Floor: - TCTS (cardiothoracic surgery) - AFib Clinic - Structural Heart Clinic - Vascular Surgery  - Vascular Ultrasound  5th Floor: - HeartCare Cardiology (general and EP) - Clinical Pharmacy for coumadin, hypertension, lipid, weight-loss medications, and med management appointments    Valet parking services will be available as well.

## 2023-09-03 NOTE — Assessment & Plan Note (Signed)
LDL <70.  LDL in October 2024 was at goal at 41.  Continue Crestor 10 mg daily.  Obtain follow-up CMET, lipids prior to next visit.

## 2023-09-03 NOTE — Assessment & Plan Note (Signed)
Moderate by echocardiogram May 2024.  Arrange follow-up echocardiogram May 2025.

## 2023-09-03 NOTE — Assessment & Plan Note (Signed)
Continue risk factor management.  He no longer requires regular follow-up with neurology.

## 2023-09-05 ENCOUNTER — Telehealth: Payer: Self-pay

## 2023-09-05 NOTE — Telephone Encounter (Signed)
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 May be slightly bigger if we use a more proximal landing zone. RAO 35 CAU 35

## 2023-09-30 LAB — COMPREHENSIVE METABOLIC PANEL
ALT: 22 IU/L (ref 0–44)
AST: 23 IU/L (ref 0–40)
Albumin: 4.5 g/dL (ref 3.9–4.9)
Alkaline Phosphatase: 78 IU/L (ref 44–121)
BUN/Creatinine Ratio: 19 (ref 10–24)
BUN: 18 mg/dL (ref 8–27)
Bilirubin Total: 0.4 mg/dL (ref 0.0–1.2)
CO2: 23 mmol/L (ref 20–29)
Calcium: 9.4 mg/dL (ref 8.6–10.2)
Chloride: 105 mmol/L (ref 96–106)
Creatinine, Ser: 0.93 mg/dL (ref 0.76–1.27)
Globulin, Total: 2 g/dL (ref 1.5–4.5)
Glucose: 82 mg/dL (ref 70–99)
Potassium: 4.7 mmol/L (ref 3.5–5.2)
Sodium: 141 mmol/L (ref 134–144)
Total Protein: 6.5 g/dL (ref 6.0–8.5)
eGFR: 90 mL/min/{1.73_m2} (ref >59)

## 2023-09-30 LAB — LIPID PANEL
Chol/HDL Ratio: 2 ratio (ref 0.0–5.0)
Cholesterol, Total: 156 mg/dL (ref 100–199)
HDL: 77 mg/dL (ref >39)
LDL Chol Calc (NIH): 64 mg/dL (ref 0–99)
Triglycerides: 77 mg/dL (ref 0–149)
VLDL Cholesterol Cal: 15 mg/dL (ref 5–40)

## 2023-10-14 NOTE — Progress Notes (Unsigned)
 Electrophysiology Office Note:    Date:  10/15/2023   ID:  Michael Oneal, DOB Oct 04, 1955, MRN [id number]  [facility] Cardiologist:  Verne Carrow, MD  [facility] Electrophysiologist:  Maurice Small, MD   Referring MD: Beatrice Lecher, PA-C   Chief Complaint: Atrial fibrillation  History of Present Illness:    Michael Oneal is a 68 year old man who I am seeing today for an evaluation of his atrial fibrillation and consideration for Watchman device implant.  He has previously seen Dr. Nelly Laurence in clinic.  The patient had a catheter ablation for his atrial fibrillation on May 13, 2023.  He is a very active man and enjoys riding his bike and skiing.  He is concerned about the long-term risks of being on anticoagulation and is interested in a left atrial appendage occlusion.  He is currently on Eliquis 5 mg by mouth twice daily.  His medical history also includes coronary artery disease, hyperlipidemia, moderate MR and a TIA.  He is with his wife today in clinic.      Their past medical, social and family history was reviewed.   ROS:   Please see the history of present illness.    All other systems reviewed and are negative.  EKGs/Labs/Other Studies Reviewed:    The following studies were reviewed today:  True plan reviewed from September 05, 2023         Physical Exam:    VS:  BP 112/72   Pulse (!) 52   Ht 5\' 11"  (1.803 m)   Wt 158 lb 6.4 oz (71.8 kg)   SpO2 98%   BMI 22.09 kg/m     Wt Readings from Last 3 Encounters:  10/15/23 158 lb 6.4 oz (71.8 kg)  09/03/23 158 lb 9.6 oz (71.9 kg)  08/15/23 161 lb (73 kg)     GEN: no distress CARD: RRR, No MRG RESP: No IWOB. CTAB.        ASSESSMENT AND PLAN:    1. PAF (paroxysmal atrial fibrillation) (HCC)   2. Coronary artery disease involving native coronary artery of native heart without angina pectoris   3. TIA (transient ischemic attack)     #Atrial fibrillation Doing well after his catheter  ablation May 13, 2023.  On Eliquis for stroke prophylaxis but desires a stroke risk mitigation strategy that avoids long-term exposure to anticoagulation given his active lifestyle and his elevated concern for serious bleeding while on blood thinners.  I have discussed left atrial appendage occlusion in detail with the patient during today's clinic appointment he is interested in proceeding.  I have seen Michael Oneal in the office today who is being considered for a Watchman left atrial appendage closure device. I believe they will benefit from this procedure given their history of atrial fibrillation, CHA2DS2-VASc score of 4. Unfortunately, the patient is not felt to be a long term anticoagulation candidate secondary to an active lifestyle with increased risk of bleeding. The patient's chart has been reviewed and I feel that they would be a candidate for short term oral anticoagulation after Watchman implant.   It is my belief that after undergoing a LAA closure procedure, Michael Oneal will not need long term anticoagulation which eliminates anticoagulation side effects and major bleeding risk.   Procedural risks for the Watchman implant have been reviewed with the patient including a 0.5% risk of stroke, <1% risk of perforation and <1% risk of device embolization. Other risks include bleeding, vascular damage, tamponade, worsening renal function, and death.  The published clinical data on the safety and effectiveness of WATCHMAN include but are not limited to the following: - Holmes DR, Everlene Farrier, Sick P et al. for the PROTECT AF Investigators. Percutaneous closure of the left atrial appendage versus warfarin therapy for prevention of stroke in patients with atrial fibrillation: a randomised non-inferiority trial. Lancet 2009; 374: 534-42. Everlene Farrier, Doshi SK, Isa Rankin D et al. on behalf of the PROTECT AF Investigators. Percutaneous Left Atrial Appendage Closure for Stroke Prophylaxis in  Patients With Atrial Fibrillation 2.3-Year Follow-up of the PROTECT AF (Watchman Left Atrial Appendage System for Embolic Protection in Patients With Atrial Fibrillation) Trial. Circulation 2013; 127:720-729. - Alli O, Doshi S,  Kar S, Reddy VY, Sievert H et al. Quality of Life Assessment in the Randomized PROTECT AF (Percutaneous Closure of the Left Atrial Appendage Versus Warfarin Therapy for Prevention of Stroke in Patients With Atrial Fibrillation) Trial of Patients at Risk for Stroke With Nonvalvular Atrial Fibrillation. J Am Coll Cardiol 2013; 61:1790-8. Aline August DR, Mia [place], Price M, Whisenant B, Sievert H, Doshi S, Huber K, Reddy V. Prospective randomized evaluation of the Watchman left atrial appendage Device in patients with atrial fibrillation versus long-term warfarin therapy; the PREVAIL trial. Journal of the [employer] of Cardiology, Vol. 4, No. 1, 2014, 1-11. - Kar S, Doshi SK, Sadhu A, Horton R, Osorio J et al. Primary outcome evaluation of a next-generation left atrial appendage closure device: results from the PINNACLE FLX trial. Circulation 2021;143(18)1754-1762.    HAS-BLED score 2 Hypertension No  Abnormal renal and liver function (Dialysis, transplant, Cr >2.26 mg/dL /Cirrhosis or Bilirubin >2x Normal or AST/ALT/AP >3x Normal) No  Stroke Yes  Bleeding No  Labile INR (Unstable/high INR) No  Elderly (>65) Yes  Drugs or alcohol (>= 8 drinks/week, anti-plt or NSAID) No   CHA2DS2-VASc Score = 4  The patient's score is based upon: CHF History: 0 HTN History: 0 Diabetes History: 0 Stroke History: 2 (possible TIA) Vascular Disease History: 1 Age Score: 1 Gender Score: 0   We also discussed using an ILR for monitoring. We discussed the limited data supporting a "pill in the pocket" approach to AF management and OAC. They will think about their options and let us know how he wants to proceed.  Signed, Rossie Muskrat. Lalla Brothers, MD, [facility], [facility] 10/15/2023 3:23 PM     Electrophysiology [facility]

## 2023-10-15 ENCOUNTER — Ambulatory Visit: Payer: Medicare Other | Attending: Cardiology | Admitting: Cardiology

## 2023-10-15 ENCOUNTER — Encounter: Payer: Self-pay | Admitting: Cardiology

## 2023-10-15 VITALS — BP 112/72 | HR 52 | Ht 71.0 in | Wt 158.4 lb

## 2023-10-15 DIAGNOSIS — I2510 Atherosclerotic heart disease of native coronary artery without angina pectoris: Secondary | ICD-10-CM | POA: Insufficient documentation

## 2023-10-15 DIAGNOSIS — I480 Paroxysmal atrial fibrillation: Secondary | ICD-10-CM | POA: Diagnosis not present

## 2023-10-15 DIAGNOSIS — G459 Transient cerebral ischemic attack, unspecified: Secondary | ICD-10-CM | POA: Diagnosis not present

## 2023-10-15 NOTE — Patient Instructions (Addendum)
 Medication Instructions:  Your physician recommends that you continue on your current medications as directed. Please refer to the Current Medication list given to you today.  *If you need a refill on your cardiac medications before your next appointment, please call your pharmacy* Follow-Up: At [facility], you and your health needs are our priority.  As part of our continuing mission to provide you with exceptional heart care, our providers are all part of one team.  This team includes your primary Cardiologist (physician) and Advanced Practice Providers or APPs (Physician Assistants and Nurse Practitioners) who all work together to provide you with the care you need, when you need it.  Your next appointment:   Give [place] a call if you would like to proceed with a Watchman device or a loop recorder implant       1st Floor: - Lobby - Registration  - Pharmacy  - Lab - Cafe  2nd Floor: - PV Lab - Diagnostic Testing (echo, CT, nuclear med)  3rd Floor: - Vacant  4th Floor: - TCTS (cardiothoracic surgery) - AFib Clinic - Structural Heart Clinic - Vascular Surgery  - Vascular Ultrasound  5th Floor: - HeartCare Cardiology (general and EP) - Clinical Pharmacy for coumadin, hypertension, lipid, weight-loss medications, and med management appointments    Valet parking services will be available as well.

## 2023-11-13 ENCOUNTER — Ambulatory Visit (HOSPITAL_COMMUNITY): Payer: Medicare Other | Attending: Cardiology

## 2023-11-13 DIAGNOSIS — I351 Nonrheumatic aortic (valve) insufficiency: Secondary | ICD-10-CM | POA: Insufficient documentation

## 2023-11-13 DIAGNOSIS — I340 Nonrheumatic mitral (valve) insufficiency: Secondary | ICD-10-CM | POA: Diagnosis not present

## 2023-11-13 DIAGNOSIS — I071 Rheumatic tricuspid insufficiency: Secondary | ICD-10-CM | POA: Diagnosis not present

## 2023-11-13 LAB — ECHOCARDIOGRAM COMPLETE
Area-P 1/2: 4.39 cm2
MV M vel: 5.25 m/s
MV Peak grad: 110.3 mmHg
S' Lateral: 2.7 cm

## 2023-11-19 ENCOUNTER — Other Ambulatory Visit: Payer: Self-pay | Admitting: Otolaryngology

## 2023-11-19 ENCOUNTER — Other Ambulatory Visit: Payer: Self-pay | Admitting: Cardiovascular Disease

## 2023-11-20 ENCOUNTER — Other Ambulatory Visit: Payer: Self-pay | Admitting: Otolaryngology

## 2023-11-20 DIAGNOSIS — K118 Other diseases of salivary glands: Secondary | ICD-10-CM

## 2023-11-24 ENCOUNTER — Ambulatory Visit: Payer: Medicare Other | Attending: Cardiovascular Disease | Admitting: Cardiovascular Disease

## 2023-11-24 ENCOUNTER — Encounter: Payer: Self-pay | Admitting: Cardiovascular Disease

## 2023-11-24 VITALS — BP 136/82 | HR 43 | Ht 71.0 in | Wt 159.8 lb

## 2023-11-24 DIAGNOSIS — E7800 Pure hypercholesterolemia, unspecified: Secondary | ICD-10-CM

## 2023-11-24 DIAGNOSIS — I351 Nonrheumatic aortic (valve) insufficiency: Secondary | ICD-10-CM | POA: Diagnosis present

## 2023-11-24 DIAGNOSIS — I340 Nonrheumatic mitral (valve) insufficiency: Secondary | ICD-10-CM | POA: Diagnosis present

## 2023-11-24 DIAGNOSIS — I2510 Atherosclerotic heart disease of native coronary artery without angina pectoris: Secondary | ICD-10-CM

## 2023-11-24 DIAGNOSIS — I480 Paroxysmal atrial fibrillation: Secondary | ICD-10-CM
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
## 2023-11-24 NOTE — Patient Instructions (Signed)
 Medication Instructions:  No changes *If you need a refill on your cardiac medications before your next appointment, please call your pharmacy*  Lab Work: none   Testing/Procedures: Echo next May 2026 before next visit with Dr. Abel Hoe  Follow-Up: At [facility], you and your health needs are our priority.  As part of our continuing mission to provide you with exceptional heart care, our providers are all part of one team.  This team includes your primary Cardiologist (physician) and Advanced Practice Providers or APPs (Physician Assistants and Nurse Practitioners) who all work together to provide you with the care you need, when you need it.  Your next appointment:   12 month(s)  Provider:   Antoinette Batman, MD

## 2023-12-09 ENCOUNTER — Inpatient Hospital Stay: Admission: RE | Admit: 2023-12-09 | Source: Ambulatory Visit

## 2023-12-09 ENCOUNTER — Ambulatory Visit
Admission: RE | Admit: 2023-12-09 | Discharge: 2023-12-09 | Disposition: A | Discharge status: Home / Self Care | Source: Ambulatory Visit | Attending: Otolaryngology | Admitting: Otolaryngology

## 2023-12-09 DIAGNOSIS — K118 Other diseases of salivary glands: Secondary | ICD-10-CM

## 2023-12-09 MED ORDER — GADOPICLENOL 0.5 MMOL/ML IV SOLN
7.5000 mL | Freq: Once | INTRAVENOUS | Status: AC | PRN
  Administered 2023-12-09: 7.5 mL via INTRAVENOUS

## 2023-12-24 ENCOUNTER — Encounter (HOSPITAL_COMMUNITY): Payer: Self-pay

## 2023-12-24 ENCOUNTER — Ambulatory Visit (HOSPITAL_COMMUNITY)
Admission: RE | Admit: 2023-12-24 | Discharge: 2023-12-24 | Disposition: A | Discharge status: Home / Self Care | Source: Ambulatory Visit | Attending: Emergency Medicine | Admitting: Emergency Medicine

## 2023-12-24 DIAGNOSIS — Z4802 Encounter for removal of sutures: Secondary | ICD-10-CM | POA: Diagnosis not present

## 2023-12-24 NOTE — Discharge Instructions (Signed)
 Your sutures were removed today. Keep the area clean dry and covered. You can apply antibiotic ointment twice daily to the area to help prevent infection. If you notice any spreading of redness, swelling, or purulent drainage return here for reevaluation.

## 2023-12-24 NOTE — Progress Notes (Signed)
 Sutures removed 6 in total , antibiotic applied and telfa and coban placed.

## 2023-12-24 NOTE — ED Triage Notes (Signed)
 Pt here for suture removal, placed in ED on June 2nd.  Pt denies pain and drainage and no redness but bruising noted

## 2023-12-24 NOTE — ED Provider Notes (Signed)
 [facility]    CSN: [id number] Arrival date & time: 12/24/23  1020      History   Chief Complaint Chief Complaint  Patient presents with   Suture / Staple Removal    Remove 6 sutures from palm of left hand. - Entered by patient    HPI Michael Oneal is a 68 y.o. male.   Patient presents here for suture removal.  Patient was seen in the ER for a fall on 6/2.  Patient had 6 sutures placed to the palm of his left hand.  Patient denies any obvious signs of infection or concerns for the wound.  The history is provided by the patient and medical records.  Suture / Staple Removal    Past Medical History:  Diagnosis Date   Allergy    Asthma    Exercise induced asthma   CAD (coronary artery disease) 12/29/2022   Coronary Ca2+ on CT   Fatigue    History of hiatal hernia    noted on CT scan   History of shingles 10/12/2014   Hyperlipidemia    Mitral regurgitation 12/29/2022   TTE 11/18/22: EF 67, no REMA, NL RVSF, NL PASP, mod MR, mild to mod TR, mild AI, RAP 3   Other abnormalities of heart beat    PAF (paroxysmal atrial fibrillation) (HCC) 12/29/2022   Monitor 11/2022: NSR, avg 58, 3 SV runs (longest 11 beats), <1% burden AFib/Flutter, rare PVCs, PACs   Palpitations    occ. random, resolves in a few minutes   Pneumothorax 05/04/2022   Mild, after injury   Right hamstring muscle strain 10/10/2014   Skin cancer    Trigeminal neuralgia     Patient Active Problem List   Diagnosis Date Noted   Hypercoagulable state due to paroxysmal atrial fibrillation (HCC) 06/10/2023   TIA (transient ischemic attack) 12/30/2022   PAF (paroxysmal atrial fibrillation) (HCC) 12/29/2022   Mitral regurgitation 12/29/2022   CAD (coronary artery disease) 12/29/2022   Hyperlipemia 04/22/2019   Elevated blood pressure reading 04/22/2019   Trigeminal neuralgia 04/22/2019   Exercise-induced asthma 04/22/2019   Palpitation 04/22/2019   Preventative health care 04/22/2019    Past  Surgical History:  Procedure Laterality Date   ATRIAL FIBRILLATION ABLATION N/A 05/13/2023   Procedure: ATRIAL FIBRILLATION ABLATION;  Surgeon: Efraim Grange, MD;  [facility];  Service: Cardiovascular;  Laterality: N/A;   MOUTH SURGERY     Dental   ORIF CLAVICULAR FRACTURE Right 05/16/2022   Procedure: OPEN REDUCTION INTERNAL FIXATION (ORIF) CLAVICULAR FRACTURE;  Surgeon: Janeth Medicus, MD;  Location: [facility];  Service: Orthopedics;  Laterality: Right;  75 wants to follow in room 3 after that case       Home Medications    Prior to Admission medications   Medication Sig Start Date End Date Taking? Authorizing Provider  apixaban  (ELIQUIS ) 5 MG TABS tablet Take 1 tablet (5 mg total) by mouth 2 (two) times daily. 12/30/22   Weaver, Scott T, PA-C  Flaxseed, Linseed, (FLAX SEEDS PO) Take 1,000 mg by mouth daily at 2 PM.    [provider]  Omega-3 Fatty Acids (FISH OIL) 1200 MG CAPS Take 1,200 mg by mouth 2 (two) times daily.    [provider]  rosuvastatin  (CRESTOR ) 10 MG tablet TAKE 1 TABLET BY MOUTH EVERY DAY. PLEASE KEEP UPCOMING APPOINTMENT FOR REFILLS 11/19/23   Odie Benne, MD    Family History Family History  Problem Relation Age of  Onset   Stroke Mother    Heart disease Mother        Valve disease/Rheumatic heart disease   Hyperlipidemia Mother    Coronary artery disease Mother    Heart disease Father    Heart attack Father        Heavy smoker    Social History Social History   Tobacco Use   Smoking status: Never   Smokeless tobacco: Never   Tobacco comments:    Never smoked 06/10/23  Vaping Use   Vaping status: Never Used  Substance Use Topics   Alcohol use: Yes    Alcohol/week: 1.0 - 2.0 standard drink of alcohol    Types: 1 - 2 Standard drinks or equivalent per week    Comment: 1-2 drinks weekly 06/10/23   Drug use: Never     Allergies   Macadamia nut oil   Review of Systems Review of  Systems  Per HPI  Physical Exam Triage Vital Signs ED Triage Vitals  Encounter Vitals Group     BP      Systolic BP Percentile      Diastolic BP Percentile      Pulse      Resp      Temp      Temp src      SpO2      Weight      Height      Head Circumference      Peak Flow      Pain Score      Pain Loc      Pain Education      Exclude from Growth Chart    No data found.  Updated Vital Signs There were no vitals taken for this visit.  Visual Acuity Right Eye Distance:   Left Eye Distance:   Bilateral Distance:    Right Eye Near:   Left Eye Near:    Bilateral Near:     Physical Exam Vitals and nursing note reviewed.  Constitutional:      General: He is awake. He is not in acute distress.    Appearance: Normal appearance. He is well-developed and well-groomed. He is not ill-appearing.  Skin:    General: Skin is warm and dry.     Findings: Laceration and wound present.     Comments: V-shaped wound noted to the palmar aspect of the left hand with 6 sutures in place.  No obvious signs of infection at this time.  Tip of the wound appears to be nonviable.  Neurological:     Mental Status: He is alert.  Psychiatric:        Behavior: Behavior is cooperative.      UC Treatments / Results  Labs (all labs ordered are listed, but only abnormal results are displayed) Labs Reviewed - No data to display  EKG   Radiology No results found.  Procedures Procedures (including critical care time)  Medications Ordered in UC Medications - No data to display  Initial Impression / Assessment and Plan / UC Course  I have reviewed the triage vital signs and the nursing notes.  Pertinent labs & imaging results that were available during my care of the patient were reviewed by me and considered in my medical decision making (see chart for details).     Patient is well-appearing.  Vitals are stable.  Upon assessment there is a V-shaped wound noted to the palmar aspect  of the left hand with 6 sutures in place.  There are no obvious signs of infection at this time.  Tip of the wound appears to be nonviable.  Sutures removed in basic wound care provided and discussed.  Discussed return precautions. Final Clinical Impressions(s) / UC Diagnoses   Final diagnoses:  Encounter for removal of sutures     Discharge Instructions      Your sutures were removed today. Keep the area clean dry and covered. You can apply antibiotic ointment twice daily to the area to help prevent infection. If you notice any spreading of redness, swelling, or purulent drainage return here for reevaluation.  ED Prescriptions   None    PDMP not reviewed this encounter.   Levora Reas A, NP 12/24/23 1113

## 2024-01-06 ENCOUNTER — Ambulatory Visit (HOSPITAL_COMMUNITY)
Admission: EM | Admit: 2024-01-06 | Discharge: 2024-01-06 | Disposition: A | Discharge status: Home / Self Care | Attending: Emergency Medicine | Admitting: Emergency Medicine

## 2024-01-06 ENCOUNTER — Encounter (HOSPITAL_COMMUNITY): Payer: Self-pay

## 2024-01-06 DIAGNOSIS — S61412D Laceration without foreign body of left hand, subsequent encounter: Secondary | ICD-10-CM

## 2024-01-06 DIAGNOSIS — Z5189 Encounter for other specified aftercare: Secondary | ICD-10-CM

## 2024-01-06 MED ORDER — MUPIROCIN 2 % EX OINT: 1.0000 | TOPICAL_OINTMENT | Freq: Two times a day (BID) | CUTANEOUS | 0 refills | Status: AC

## 2024-01-06 NOTE — Discharge Instructions (Addendum)
 Wash area twice daily with mild soap and water. Apply the bactroban ointment after washing. Continue to monitor the area, and return if needed

## 2024-01-06 NOTE — ED Triage Notes (Signed)
 Patient states he had sutures removed from his left palm 2 weeks ago and now has an area that is slightly red and swollen.

## 2024-01-06 NOTE — ED Provider Notes (Signed)
 [facility]    CSN: [id number] Arrival date & time: 01/06/24  0805      History   Chief Complaint Chief Complaint  Patient presents with   Wound Check    HPI Michael Oneal is a 68 y.o. male.  Wound check Had left hand laceration on 6/2, repaired at [facility] ED in [place]. Sutures were then removed on 6/11 at this urgent care. He returns today to check how healing is progressing. Was concerned about area being slightly raised. It has been like this since removal. He has not had worsening pain, redness, swelling, or any new drainage. Able to move the hand without pain. No paresthesias.  Has been using neosporin   Past Medical History:  Diagnosis Date   Allergy    Asthma    Exercise induced asthma   CAD (coronary artery disease) 12/29/2022   Coronary Ca2+ on CT   Fatigue    History of hiatal hernia    noted on CT scan   History of shingles 10/12/2014   Hyperlipidemia    Mitral regurgitation 12/29/2022   TTE 11/18/22: EF 67, no REMA, NL RVSF, NL PASP, mod MR, mild to mod TR, mild AI, RAP 3   Other abnormalities of heart beat    PAF (paroxysmal atrial fibrillation) (HCC) 12/29/2022   Monitor 11/2022: NSR, avg 58, 3 SV runs (longest 11 beats), <1% burden AFib/Flutter, rare PVCs, PACs   Palpitations    occ. random, resolves in a few minutes   Pneumothorax 05/04/2022   Mild, after injury   Right hamstring muscle strain 10/10/2014   Skin cancer    Trigeminal neuralgia     Patient Active Problem List   Diagnosis Date Noted   Hypercoagulable state due to paroxysmal atrial fibrillation (HCC) 06/10/2023   TIA (transient ischemic attack) 12/30/2022   PAF (paroxysmal atrial fibrillation) (HCC) 12/29/2022   Mitral regurgitation 12/29/2022   CAD (coronary artery disease) 12/29/2022   Hyperlipemia 04/22/2019   Elevated blood pressure reading 04/22/2019   Trigeminal neuralgia 04/22/2019   Exercise-induced asthma 04/22/2019   Palpitation 04/22/2019   Preventative  health care 04/22/2019    Past Surgical History:  Procedure Laterality Date   ATRIAL FIBRILLATION ABLATION N/A 05/13/2023   Procedure: ATRIAL FIBRILLATION ABLATION;  Surgeon: Nancey Eulas BRAVO, MD;  [facility];  Service: Cardiovascular;  Laterality: N/A;   MOUTH SURGERY     Dental   ORIF CLAVICULAR FRACTURE Right 05/16/2022   Procedure: OPEN REDUCTION INTERNAL FIXATION (ORIF) CLAVICULAR FRACTURE;  Surgeon: Sharl Selinda Dover, MD;  Location: [facility];  Service: Orthopedics;  Laterality: Right;  75 wants to follow in room 3 after that case       Home Medications    Prior to Admission medications   Medication Sig Start Date End Date Taking? Authorizing Provider  mupirocin ointment (BACTROBAN) 2 % Apply 1 Application topically 2 (two) times daily. 01/06/24  Yes Khyleigh Furney, [place], PA-C  apixaban  (ELIQUIS ) 5 MG TABS tablet Take 1 tablet (5 mg total) by mouth 2 (two) times daily. 12/30/22   Weaver, Scott T, PA-C  Flaxseed, Linseed, (FLAX SEEDS PO) Take 1,000 mg by mouth daily at 2 PM.    [provider]  Omega-3 Fatty Acids (FISH OIL) 1200 MG CAPS Take 1,200 mg by mouth 2 (two) times daily.    [provider]  rosuvastatin  (CRESTOR ) 10 MG tablet TAKE 1 TABLET BY MOUTH EVERY DAY. PLEASE KEEP UPCOMING APPOINTMENT FOR REFILLS 11/19/23   Verlin Bruckner  D, MD    Family History Family History  Problem Relation Age of Onset   Stroke Mother    Heart disease Mother        Valve disease/Rheumatic heart disease   Hyperlipidemia Mother    Coronary artery disease Mother    Heart disease Father    Heart attack Father        Heavy smoker    Social History Social History   Tobacco Use   Smoking status: Never   Smokeless tobacco: Never   Tobacco comments:    Never smoked 06/10/23  Vaping Use   Vaping status: Never Used  Substance Use Topics   Alcohol use: Yes    Alcohol/week: 1.0 - 2.0 standard drink of alcohol    Types: 1 - 2 Standard drinks or  equivalent per week    Comment: 1-2 drinks weekly 06/10/23   Drug use: Never     Allergies   Macadamia nut oil   Review of Systems Review of Systems As per HPI  Physical Exam Triage Vital Signs ED Triage Vitals  Encounter Vitals Group     BP 01/06/24 0825 127/81     Girls Systolic BP Percentile --      Girls Diastolic BP Percentile --      Boys Systolic BP Percentile --      Boys Diastolic BP Percentile --      Pulse Rate 01/06/24 0825 (!) 47     Resp 01/06/24 0825 14     Temp 01/06/24 0825 98.5 F (36.9 C)     Temp Source 01/06/24 0825 Oral     SpO2 01/06/24 0825 100 %     Weight --      Height --      Head Circumference --      Peak Flow --      Pain Score 01/06/24 0824 2     Pain Loc --      Pain Education --      Exclude from Growth Chart --    No data found.  Updated Vital Signs BP 127/81 (BP Location: Right Arm)   Pulse (!) 47   Temp 98.5 F (36.9 C) (Oral)   Resp 14   SpO2 100%   Visual Acuity Right Eye Distance:   Left Eye Distance:   Bilateral Distance:    Right Eye Near:   Left Eye Near:    Bilateral Near:     Physical Exam Vitals and nursing note reviewed.  Constitutional:      General: He is not in acute distress.    Appearance: Normal appearance.  HENT:     Mouth/Throat:     Pharynx: Oropharynx is clear.   Cardiovascular:     Rate and Rhythm: Normal rate and regular rhythm.     Pulses: Normal pulses.  Pulmonary:     Effort: Pulmonary effort is normal.   Musculoskeletal:     Comments: Healing wound on the left palm. There is flaking dry skin and dried blood around. Area is slightly raised. There is no tenderness, fluctuance, induration, drainage, bleeding. Full ROM of hand and fingers. Distal sensation intact, cap refill < 2 seconds   Neurological:     Mental Status: He is alert and oriented to person, place, and time.     UC Treatments / Results  Labs (all labs ordered are listed, but only abnormal results are  displayed) Labs Reviewed - No data to display  EKG  Radiology No results found.  Procedures Procedures (including critical care time)  Medications Ordered in UC Medications - No data to display  Initial Impression / Assessment and Plan / UC Course  I have reviewed the triage vital signs and the nursing notes.  Pertinent labs & imaging results that were available during my care of the patient were reviewed by me and considered in my medical decision making (see chart for details).  Healing wound Likely scar tissue, discussed with patient this is likely the raised area, still healing. Recommended wound care at home. Can also use bactroban BID. As of now no signs of active infection. Monitor and return if needed. Patient without questions  Final Clinical Impressions(s) / UC Diagnoses   Final diagnoses:  None     Discharge Instructions      Wash area twice daily with mild soap and water. Apply the bactroban ointment after washing. Continue to monitor the area, and return if needed    ED Prescriptions     Medication Sig Dispense Auth. Provider   mupirocin ointment (BACTROBAN) 2 % Apply 1 Application topically 2 (two) times daily. 15 g Windle Huebert, [place], PA-C      PDMP not reviewed this encounter.   Tylynn Braniff, PA-C 01/06/24 1130

## 2024-01-12 ENCOUNTER — Other Ambulatory Visit: Payer: Self-pay | Admitting: Physician Assistant

## 2024-01-12 NOTE — Telephone Encounter (Signed)
 Prescription refill request for Eliquis  received. Indication:afib Last office visit:5/25 Scr:0.9  6/25 Age: 68 Weight:72.5  kg  Prescription refilled

## 2024-01-26 ENCOUNTER — Ambulatory Visit (HOSPITAL_COMMUNITY)
Admission: RE | Admit: 2024-01-26 | Discharge: 2024-01-26 | Disposition: A | Discharge status: Home / Self Care | Source: Ambulatory Visit

## 2024-01-26 ENCOUNTER — Encounter (HOSPITAL_COMMUNITY): Payer: Self-pay

## 2024-01-26 VITALS — BP 145/80 | HR 46 | Temp 97.6°F | Resp 16

## 2024-01-26 DIAGNOSIS — L910 Hypertrophic scar: Secondary | ICD-10-CM | POA: Diagnosis not present

## 2024-01-26 NOTE — ED Provider Notes (Signed)
 [facility]    CSN: [id number] Arrival date & time: 01/26/24  1213      History   Chief Complaint Chief Complaint  Patient presents with   Wound Check    Appt 1230    HPI Michael Oneal is a 68 y.o. male.   HPI  He is in today for evaluation of his left palm.  6 weeks ago [place] the beach and [place] 15-16 steps with a glass in his hand sustained rib fractures and left hand laceration.  He continues to heal from the rib fracture.  He left hand is completely healed.  He does have a scar noted in the palm of the hand.  He endorses that there is some mild tenderness around the area.  He does have full range of motion.  He is currently patient in [facility] where he was getting physical therapy. Past Medical History:  Diagnosis Date   Allergy    Asthma    Exercise induced asthma   CAD (coronary artery disease) 12/29/2022   Coronary Ca2+ on CT   Fatigue    History of hiatal hernia    noted on CT scan   History of shingles 10/12/2014   Hyperlipidemia    Mitral regurgitation 12/29/2022   TTE 11/18/22: EF 67, no REMA, NL RVSF, NL PASP, mod MR, mild to mod TR, mild AI, RAP 3   Other abnormalities of heart beat    PAF (paroxysmal atrial fibrillation) (HCC) 12/29/2022   Monitor 11/2022: NSR, avg 58, 3 SV runs (longest 11 beats), <1% burden AFib/Flutter, rare PVCs, PACs   Palpitations    occ. random, resolves in a few minutes   Pneumothorax 05/04/2022   Mild, after injury   Right hamstring muscle strain 10/10/2014   Skin cancer    Trigeminal neuralgia     Patient Active Problem List   Diagnosis Date Noted   Hypercoagulable state due to paroxysmal atrial fibrillation (HCC) 06/10/2023   TIA (transient ischemic attack) 12/30/2022   PAF (paroxysmal atrial fibrillation) (HCC) 12/29/2022   Mitral regurgitation 12/29/2022   CAD (coronary artery disease) 12/29/2022   Hyperlipemia 04/22/2019   Elevated blood pressure reading 04/22/2019   Trigeminal neuralgia  04/22/2019   Exercise-induced asthma 04/22/2019   Palpitation 04/22/2019   Preventative health care 04/22/2019    Past Surgical History:  Procedure Laterality Date   ATRIAL FIBRILLATION ABLATION N/A 05/13/2023   Procedure: ATRIAL FIBRILLATION ABLATION;  Surgeon: Nancey Eulas BRAVO, MD;  Location: [facility];  Service: Cardiovascular;  Laterality: N/A;   MOUTH SURGERY     Dental   ORIF CLAVICULAR FRACTURE Right 05/16/2022   Procedure: OPEN REDUCTION INTERNAL FIXATION (ORIF) CLAVICULAR FRACTURE;  Surgeon: Sharl Selinda Dover, MD;  Location: [facility];  Service: Orthopedics;  Laterality: Right;  75 wants to follow in room 3 after that case       Home Medications    Prior to Admission medications   Medication Sig Start Date End Date Taking? Authorizing Provider  Cyanocobalamin (VITAMIN B-12 PO) Take by mouth.   Yes [provider]  ELIQUIS  5 MG TABS tablet TAKE 1 TABLET(5 MG) BY MOUTH TWICE DAILY 01/12/24  Yes Weaver, Scott T, PA-C  Flaxseed, Linseed, (FLAX SEEDS PO) Take 1,000 mg by mouth daily at 2 PM.   Yes [provider]  Omega-3 Fatty Acids (FISH OIL) 1200 MG CAPS Take 1,200 mg by mouth 2 (two) times daily.   Yes [provider]  rosuvastatin  (CRESTOR )  10 MG tablet TAKE 1 TABLET BY MOUTH EVERY DAY. PLEASE KEEP UPCOMING APPOINTMENT FOR REFILLS 11/19/23  Yes Verlin Lonni BIRCH, MD  mupirocin  ointment (BACTROBAN ) 2 % Apply 1 Application topically 2 (two) times daily. 01/06/24   Rising, Asberry, PA-C    Family History Family History  Problem Relation Age of Onset   Stroke Mother    Heart disease Mother        Valve disease/Rheumatic heart disease   Hyperlipidemia Mother    Coronary artery disease Mother    Heart disease Father    Heart attack Father        Heavy smoker    Social History Social History   Tobacco Use   Smoking status: Never   Smokeless tobacco: Never   Tobacco comments:    Never smoked 06/10/23  Vaping Use   Vaping  status: Never Used  Substance Use Topics   Alcohol use: Yes    Alcohol/week: 1.0 - 2.0 standard drink of alcohol    Types: 1 - 2 Standard drinks or equivalent per week    Comment: 1-2 drinks weekly 06/10/23   Drug use: Never     Allergies   Macadamia nut oil   Review of Systems Review of Systems   Physical Exam Triage Vital Signs ED Triage Vitals  Encounter Vitals Group     BP 01/26/24 1242 (!) 145/80     Girls Systolic BP Percentile --      Girls Diastolic BP Percentile --      Boys Systolic BP Percentile --      Boys Diastolic BP Percentile --      Pulse Rate 01/26/24 1242 (!) 46     Resp 01/26/24 1242 16     Temp 01/26/24 1242 97.6 F (36.4 C)     Temp Source 01/26/24 1242 Oral     SpO2 01/26/24 1242 97 %     Weight --      Height --      Head Circumference --      Peak Flow --      Pain Score 01/26/24 1245 0     Pain Loc --      Pain Education --      Exclude from Growth Chart --    No data found.  Updated Vital Signs BP (!) 145/80   Pulse (!) 46   Temp 97.6 F (36.4 C) (Oral)   Resp 16   SpO2 97%   Visual Acuity Right Eye Distance:   Left Eye Distance:   Bilateral Distance:    Right Eye Near:   Left Eye Near:    Bilateral Near:     Physical Exam Constitutional:      Appearance: He is normal weight.  HENT:     Head: Normocephalic and atraumatic.  Cardiovascular:     Rate and Rhythm: Normal rate.  Pulmonary:     Effort: Pulmonary effort is normal.  Musculoskeletal:     Left hand: Deformity (Keloid type scar to left lateral palm) present.  Neurological:     Mental Status: He is alert.      UC Treatments / Results  Labs (all labs ordered are listed, but only abnormal results are displayed) Labs Reviewed - No data to display  EKG   Radiology No results found.  Procedures Procedures (including critical care time)  Medications Ordered in UC Medications - No data to display  Initial Impression / Assessment and Plan / UC  Course  I have  reviewed the triage vital signs and the nursing notes.  Pertinent labs & imaging results that were available during my care of the patient were reviewed by me and considered in my medical decision making (see chart for details).     Evaluation Final Clinical Impressions(s) / UC Diagnoses   Final diagnoses:  Keloid scar of skin     Discharge Instructions      You have been diagnosed with a the recommendation is to follow-up with a hand surgeon or plastic surgeon.  Since you are a patient at [facility] they may be able to assist you and find additional treatment options.     ED Prescriptions   None    PDMP not reviewed this encounter.   Myrna Salines [place], [place] 01/26/24 [phone]

## 2024-01-26 NOTE — Discharge Instructions (Addendum)
 You have been diagnosed with a the recommendation is to follow-up with a hand surgeon or plastic surgeon.  Since you are a patient at [facility] they may be able to assist you and find additional treatment options.

## 2024-01-26 NOTE — ED Triage Notes (Signed)
 Pt sustained laceration to left palm 12/15/23 and received sutures. Pt was seen again 01/06/24 for new skin to site being swollen and painful. States over past few days pain as resolved, but is concerned about the lumpiness and redness of the new skin/tissue.

## 2024-09-30 ENCOUNTER — Ambulatory Visit: Admitting: Physician Assistant

## 2024-10-13 ENCOUNTER — Ambulatory Visit (HOSPITAL_COMMUNITY)

## 2024-11-18 ENCOUNTER — Ambulatory Visit: Admitting: Cardiovascular Disease
